# Patient Record
Sex: Male | Born: 1955 | Race: Black or African American | Hispanic: No | Marital: Single | Smoking: Former smoker
Health system: Southern US, Community
[De-identification: ages and names within clinical notes are randomized; demographics above are authoritative.]

## PROBLEM LIST (undated history)

## (undated) DIAGNOSIS — E663 Overweight: Secondary | ICD-10-CM

## (undated) DIAGNOSIS — I2510 Atherosclerotic heart disease of native coronary artery without angina pectoris: Secondary | ICD-10-CM

## (undated) DIAGNOSIS — I10 Essential (primary) hypertension: Secondary | ICD-10-CM

## (undated) DIAGNOSIS — Z720 Tobacco use: Secondary | ICD-10-CM

## (undated) DIAGNOSIS — I255 Ischemic cardiomyopathy: Secondary | ICD-10-CM

## (undated) DIAGNOSIS — I5022 Chronic systolic (congestive) heart failure: Secondary | ICD-10-CM

## (undated) DIAGNOSIS — E785 Hyperlipidemia, unspecified: Secondary | ICD-10-CM

---

## 2014-02-18 ENCOUNTER — Inpatient Hospital Stay (HOSPITAL_COMMUNITY)
Admission: EM | Admit: 2014-02-18 | Discharge: 2014-02-25 | DRG: 246 | Disposition: A | Discharge status: Home / Self Care | Payer: BC Managed Care – PPO | Attending: Cardiovascular Disease | Admitting: Cardiovascular Disease

## 2014-02-18 DIAGNOSIS — I255 Ischemic cardiomyopathy: Secondary | ICD-10-CM

## 2014-02-18 DIAGNOSIS — Z955 Presence of coronary angioplasty implant and graft: Secondary | ICD-10-CM

## 2014-02-18 DIAGNOSIS — Z87891 Personal history of nicotine dependence: Secondary | ICD-10-CM

## 2014-02-18 DIAGNOSIS — I32 Pericarditis in diseases classified elsewhere: Secondary | ICD-10-CM

## 2014-02-18 DIAGNOSIS — I2510 Atherosclerotic heart disease of native coronary artery without angina pectoris: Secondary | ICD-10-CM

## 2014-02-18 DIAGNOSIS — I2109 ST elevation (STEMI) myocardial infarction involving other coronary artery of anterior wall: Principal | ICD-10-CM

## 2014-02-18 DIAGNOSIS — I219 Acute myocardial infarction, unspecified: Secondary | ICD-10-CM

## 2014-02-18 DIAGNOSIS — R7309 Other abnormal glucose: Secondary | ICD-10-CM | POA: Diagnosis present

## 2014-02-18 DIAGNOSIS — I2589 Other forms of chronic ischemic heart disease: Secondary | ICD-10-CM | POA: Diagnosis present

## 2014-02-18 DIAGNOSIS — I5031 Acute diastolic (congestive) heart failure: Secondary | ICD-10-CM

## 2014-02-18 DIAGNOSIS — I959 Hypotension, unspecified: Secondary | ICD-10-CM | POA: Diagnosis not present

## 2014-02-18 DIAGNOSIS — I5041 Acute combined systolic (congestive) and diastolic (congestive) heart failure: Secondary | ICD-10-CM

## 2014-02-18 DIAGNOSIS — J9600 Acute respiratory failure, unspecified whether with hypoxia or hypercapnia: Secondary | ICD-10-CM

## 2014-02-18 DIAGNOSIS — E785 Hyperlipidemia, unspecified: Secondary | ICD-10-CM

## 2014-02-18 DIAGNOSIS — I509 Heart failure, unspecified: Secondary | ICD-10-CM | POA: Diagnosis present

## 2014-02-18 DIAGNOSIS — F17200 Nicotine dependence, unspecified, uncomplicated: Secondary | ICD-10-CM | POA: Diagnosis present

## 2014-02-18 DIAGNOSIS — Z720 Tobacco use: Secondary | ICD-10-CM | POA: Diagnosis present

## 2014-02-18 DIAGNOSIS — I10 Essential (primary) hypertension: Secondary | ICD-10-CM | POA: Diagnosis present

## 2014-02-18 DIAGNOSIS — I241 Dressler's syndrome: Secondary | ICD-10-CM

## 2014-02-18 HISTORY — DX: Essential (primary) hypertension: I10

## 2014-02-18 HISTORY — DX: Overweight: E66.3

## 2014-02-18 HISTORY — DX: Hyperlipidemia, unspecified: E78.5

## 2014-02-18 HISTORY — DX: Ischemic cardiomyopathy: I25.5

## 2014-02-18 HISTORY — DX: Tobacco use: Z72.0

## 2014-02-18 HISTORY — DX: Atherosclerotic heart disease of native coronary artery without angina pectoris: I25.10

## 2014-02-19 ENCOUNTER — Encounter (HOSPITAL_COMMUNITY)
Admission: EM | Disposition: A | Discharge status: Home / Self Care | Payer: BC Managed Care – PPO | Source: Home / Self Care | Attending: Cardiovascular Disease

## 2014-02-19 ENCOUNTER — Ambulatory Visit (HOSPITAL_COMMUNITY): Admit: 2014-02-19 | Payer: Self-pay | Admitting: Cardiovascular Disease

## 2014-02-19 ENCOUNTER — Inpatient Hospital Stay (HOSPITAL_COMMUNITY): Payer: BC Managed Care – PPO

## 2014-02-19 ENCOUNTER — Encounter (HOSPITAL_COMMUNITY): Payer: Self-pay | Admitting: Emergency Medicine

## 2014-02-19 DIAGNOSIS — I2109 ST elevation (STEMI) myocardial infarction involving other coronary artery of anterior wall: Secondary | ICD-10-CM

## 2014-02-19 DIAGNOSIS — I959 Hypotension, unspecified: Secondary | ICD-10-CM | POA: Diagnosis not present

## 2014-02-19 DIAGNOSIS — I5031 Acute diastolic (congestive) heart failure: Secondary | ICD-10-CM | POA: Insufficient documentation

## 2014-02-19 DIAGNOSIS — I5041 Acute combined systolic (congestive) and diastolic (congestive) heart failure: Secondary | ICD-10-CM | POA: Diagnosis present

## 2014-02-19 DIAGNOSIS — I2589 Other forms of chronic ischemic heart disease: Secondary | ICD-10-CM | POA: Diagnosis present

## 2014-02-19 DIAGNOSIS — J9600 Acute respiratory failure, unspecified whether with hypoxia or hypercapnia: Secondary | ICD-10-CM | POA: Diagnosis present

## 2014-02-19 DIAGNOSIS — I509 Heart failure, unspecified: Secondary | ICD-10-CM | POA: Diagnosis present

## 2014-02-19 DIAGNOSIS — I10 Essential (primary) hypertension: Secondary | ICD-10-CM | POA: Diagnosis present

## 2014-02-19 DIAGNOSIS — R7309 Other abnormal glucose: Secondary | ICD-10-CM | POA: Diagnosis present

## 2014-02-19 DIAGNOSIS — I2510 Atherosclerotic heart disease of native coronary artery without angina pectoris: Secondary | ICD-10-CM | POA: Diagnosis present

## 2014-02-19 DIAGNOSIS — R079 Chest pain, unspecified: Secondary | ICD-10-CM | POA: Diagnosis present

## 2014-02-19 DIAGNOSIS — F17200 Nicotine dependence, unspecified, uncomplicated: Secondary | ICD-10-CM | POA: Diagnosis present

## 2014-02-19 DIAGNOSIS — E785 Hyperlipidemia, unspecified: Secondary | ICD-10-CM | POA: Diagnosis present

## 2014-02-19 DIAGNOSIS — Z87891 Personal history of nicotine dependence: Secondary | ICD-10-CM | POA: Diagnosis not present

## 2014-02-19 HISTORY — PX: LEFT HEART CATHETERIZATION WITH CORONARY ANGIOGRAM: SHX5451

## 2014-02-19 HISTORY — PX: PERCUTANEOUS CORONARY STENT INTERVENTION (PCI-S): SHX5485

## 2014-02-19 LAB — CBC
HCT: 54.6 % — ABNORMAL HIGH (ref 39.0–52.0)
HEMATOCRIT: 46 % (ref 39.0–52.0)
HEMATOCRIT: 50.4 % (ref 39.0–52.0)
HEMOGLOBIN: 18.2 g/dL — AB (ref 13.0–17.0)
Hemoglobin: 15.3 g/dL (ref 13.0–17.0)
Hemoglobin: 16.4 g/dL (ref 13.0–17.0)
MCH: 28.8 pg (ref 26.0–34.0)
MCH: 29.2 pg (ref 26.0–34.0)
MCH: 29.2 pg (ref 26.0–34.0)
MCHC: 32.5 g/dL (ref 30.0–36.0)
MCHC: 33.3 g/dL (ref 30.0–36.0)
MCHC: 33.3 g/dL (ref 30.0–36.0)
MCV: 87.5 fL (ref 78.0–100.0)
MCV: 87.8 fL (ref 78.0–100.0)
MCV: 88.6 fL (ref 78.0–100.0)
PLATELETS: 180 10*3/uL (ref 150–400)
Platelets: 170 10*3/uL (ref 150–400)
Platelets: 170 10*3/uL (ref 150–400)
RBC: 5.24 MIL/uL (ref 4.22–5.81)
RBC: 5.69 MIL/uL (ref 4.22–5.81)
RBC: 6.24 MIL/uL — ABNORMAL HIGH (ref 4.22–5.81)
RDW: 14.3 % (ref 11.5–15.5)
RDW: 14.5 % (ref 11.5–15.5)
RDW: 14.6 % (ref 11.5–15.5)
WBC: 12.1 10*3/uL — ABNORMAL HIGH (ref 4.0–10.5)
WBC: 8 10*3/uL (ref 4.0–10.5)
WBC: 8.4 10*3/uL (ref 4.0–10.5)

## 2014-02-19 LAB — BASIC METABOLIC PANEL
BUN: 15 mg/dL (ref 6–23)
BUN: 16 mg/dL (ref 6–23)
CALCIUM: 8.8 mg/dL (ref 8.4–10.5)
CO2: 22 mEq/L (ref 19–32)
CO2: 24 mEq/L (ref 19–32)
Calcium: 8.6 mg/dL (ref 8.4–10.5)
Chloride: 105 mEq/L (ref 96–112)
Chloride: 107 mEq/L (ref 96–112)
Creatinine, Ser: 0.96 mg/dL (ref 0.50–1.35)
Creatinine, Ser: 1.11 mg/dL (ref 0.50–1.35)
GFR calc Af Amer: 84 mL/min — ABNORMAL LOW (ref >90)
GFR calc Af Amer: >90 mL/min (ref >90)
GFR, EST NON AFRICAN AMERICAN: 72 mL/min — AB (ref >90)
GLUCOSE: 107 mg/dL — AB (ref 70–99)
GLUCOSE: 157 mg/dL — AB (ref 70–99)
POTASSIUM: 3.9 meq/L (ref 3.7–5.3)
Potassium: 3.9 mEq/L (ref 3.7–5.3)
SODIUM: 143 meq/L (ref 137–147)
Sodium: 143 mEq/L (ref 137–147)

## 2014-02-19 LAB — DIFFERENTIAL
Basophils Absolute: 0 10*3/uL (ref 0.0–0.1)
Basophils Relative: 1 % (ref 0–1)
EOS PCT: 4 % (ref 0–5)
Eosinophils Absolute: 0.4 10*3/uL (ref 0.0–0.7)
LYMPHS ABS: 3.7 10*3/uL (ref 0.7–4.0)
Lymphocytes Relative: 43 % (ref 12–46)
MONO ABS: 0.9 10*3/uL (ref 0.1–1.0)
MONOS PCT: 10 % (ref 3–12)
Neutro Abs: 3.5 10*3/uL (ref 1.7–7.7)
Neutrophils Relative %: 42 % — ABNORMAL LOW (ref 43–77)

## 2014-02-19 LAB — I-STAT CHEM 8, ED
BUN: 15 mg/dL (ref 6–23)
CREATININE: 1.2 mg/dL (ref 0.50–1.35)
Calcium, Ion: 1.13 mmol/L (ref 1.12–1.23)
Chloride: 106 mEq/L (ref 96–112)
Glucose, Bld: 150 mg/dL — ABNORMAL HIGH (ref 70–99)
HEMATOCRIT: 51 % (ref 39.0–52.0)
HEMOGLOBIN: 17.3 g/dL — AB (ref 13.0–17.0)
POTASSIUM: 3.7 meq/L (ref 3.7–5.3)
SODIUM: 144 meq/L (ref 137–147)
TCO2: 23 mmol/L (ref 0–100)

## 2014-02-19 LAB — TROPONIN I
Troponin I: <0.3 ng/mL (ref <0.30)
Troponin I: >20 ng/mL (ref <0.30)

## 2014-02-19 LAB — TSH: TSH: 0.523 u[IU]/mL (ref 0.350–4.500)

## 2014-02-19 LAB — POCT I-STAT TROPONIN I: Troponin i, poc: 0.01 ng/mL (ref 0.00–0.08)

## 2014-02-19 LAB — APTT: aPTT: 29 seconds (ref 24–37)

## 2014-02-19 LAB — LIPID PANEL
CHOLESTEROL: 164 mg/dL (ref 0–200)
HDL: 40 mg/dL (ref >39)
LDL Cholesterol: 114 mg/dL — ABNORMAL HIGH (ref 0–99)
TRIGLYCERIDES: 49 mg/dL (ref <150)
Total CHOL/HDL Ratio: 4.1 RATIO
VLDL: 10 mg/dL (ref 0–40)

## 2014-02-19 LAB — HEMOGLOBIN A1C
HEMOGLOBIN A1C: 6.1 % — AB (ref <5.7)
Mean Plasma Glucose: 128 mg/dL — ABNORMAL HIGH (ref <117)

## 2014-02-19 LAB — PRO B NATRIURETIC PEPTIDE: Pro B Natriuretic peptide (BNP): 113.7 pg/mL (ref 0–125)

## 2014-02-19 LAB — PROTIME-INR
INR: 0.98 (ref 0.00–1.49)
PROTHROMBIN TIME: 12.8 s (ref 11.6–15.2)

## 2014-02-19 LAB — MRSA PCR SCREENING: MRSA BY PCR: NEGATIVE

## 2014-02-19 SURGERY — LEFT HEART CATHETERIZATION WITH CORONARY ANGIOGRAM
Anesthesia: LOCAL

## 2014-02-19 MED ORDER — HEPARIN SODIUM (PORCINE) 5000 UNIT/ML IJ SOLN
4000.0000 [IU] | Freq: Once | INTRAMUSCULAR | Status: AC
  Administered 2014-02-19: 4000 [IU] via INTRAVENOUS

## 2014-02-19 MED ORDER — PNEUMOCOCCAL VAC POLYVALENT 25 MCG/0.5ML IJ INJ
0.5000 mL | INJECTION | INTRAMUSCULAR | Status: DC
  Filled 2014-02-19: qty 0.5

## 2014-02-19 MED ORDER — INFLUENZA VAC SPLIT QUAD 0.5 ML IM SUSP
0.5000 mL | INTRAMUSCULAR | Status: DC
  Filled 2014-02-19: qty 0.5

## 2014-02-19 MED ORDER — TICAGRELOR 90 MG PO TABS
ORAL_TABLET | ORAL | Status: AC
Start: 2014-02-19 — End: 2014-02-19
  Filled 2014-02-19: qty 2

## 2014-02-19 MED ORDER — SODIUM CHLORIDE 0.9 % IJ SOLN
3.0000 mL | Freq: Two times a day (BID) | INTRAMUSCULAR | Status: DC
  Administered 2014-02-19 – 2014-02-24 (×13): 3 mL via INTRAVENOUS

## 2014-02-19 MED ORDER — HEPARIN SODIUM (PORCINE) 5000 UNIT/ML IJ SOLN
5000.0000 [IU] | Freq: Three times a day (TID) | INTRAMUSCULAR | Status: DC
  Administered 2014-02-20 – 2014-02-25 (×15): 5000 [IU] via SUBCUTANEOUS
  Filled 2014-02-19 (×18): qty 1

## 2014-02-19 MED ORDER — NITROGLYCERIN 0.4 MG SL SUBL
0.4000 mg | SUBLINGUAL_TABLET | SUBLINGUAL | Status: DC | PRN
  Administered 2014-02-23: 0.4 mg via SUBLINGUAL
  Filled 2014-02-19: qty 1

## 2014-02-19 MED ORDER — HEPARIN SODIUM (PORCINE) 5000 UNIT/ML IJ SOLN
INTRAMUSCULAR | Status: AC
  Filled 2014-02-19: qty 1

## 2014-02-19 MED ORDER — VERAPAMIL HCL 2.5 MG/ML IV SOLN
INTRAVENOUS | Status: AC
  Filled 2014-02-19: qty 2

## 2014-02-19 MED ORDER — FUROSEMIDE 10 MG/ML IJ SOLN
INTRAMUSCULAR | Status: AC
  Filled 2014-02-19: qty 4

## 2014-02-19 MED ORDER — HYDROMORPHONE HCL PF 1 MG/ML IJ SOLN
INTRAMUSCULAR | Status: AC
  Filled 2014-02-19: qty 1

## 2014-02-19 MED ORDER — NITROGLYCERIN 0.2 MG/ML ON CALL CATH LAB
INTRAVENOUS | Status: AC
  Filled 2014-02-19: qty 1

## 2014-02-19 MED ORDER — SODIUM CHLORIDE 0.9 % IV SOLN: 250.0000 mL | INTRAVENOUS | Status: DC | PRN

## 2014-02-19 MED ORDER — FENTANYL CITRATE 0.05 MG/ML IJ SOLN
INTRAMUSCULAR | Status: AC
  Filled 2014-02-19: qty 2

## 2014-02-19 MED ORDER — TIROFIBAN HCL IV 5 MG/100ML
0.1500 ug/kg/min | INTRAVENOUS | Status: AC
  Administered 2014-02-19: 0.15 ug/kg/min via INTRAVENOUS
  Filled 2014-02-19 (×2): qty 100

## 2014-02-19 MED ORDER — ATORVASTATIN CALCIUM 80 MG PO TABS
80.0000 mg | ORAL_TABLET | Freq: Every day | ORAL | Status: DC
  Administered 2014-02-19 – 2014-02-24 (×6): 80 mg via ORAL
  Filled 2014-02-19 (×7): qty 1

## 2014-02-19 MED ORDER — BIVALIRUDIN 250 MG IV SOLR
INTRAVENOUS | Status: AC
Start: 2014-02-19 — End: 2014-02-19
  Filled 2014-02-19: qty 250

## 2014-02-19 MED ORDER — SPIRONOLACTONE 12.5 MG HALF TABLET
12.5000 mg | ORAL_TABLET | Freq: Every day | ORAL | Status: DC
  Administered 2014-02-19 – 2014-02-25 (×7): 12.5 mg via ORAL
  Filled 2014-02-19 (×7): qty 1

## 2014-02-19 MED ORDER — HEPARIN (PORCINE) IN NACL 2-0.9 UNIT/ML-% IJ SOLN
INTRAMUSCULAR | Status: AC
  Filled 2014-02-19: qty 1500

## 2014-02-19 MED ORDER — FUROSEMIDE 10 MG/ML IJ SOLN
40.0000 mg | Freq: Once | INTRAMUSCULAR | Status: AC
  Administered 2014-02-19: 40 mg via INTRAVENOUS

## 2014-02-19 MED ORDER — POTASSIUM CHLORIDE ER 10 MEQ PO TBCR
20.0000 meq | EXTENDED_RELEASE_TABLET | Freq: Two times a day (BID) | ORAL | Status: DC
  Administered 2014-02-19 (×2): 20 meq via ORAL
  Filled 2014-02-19 (×4): qty 2

## 2014-02-19 MED ORDER — ENALAPRIL MALEATE 5 MG PO TABS
5.0000 mg | ORAL_TABLET | Freq: Two times a day (BID) | ORAL | Status: DC
  Administered 2014-02-19 – 2014-02-20 (×4): 5 mg via ORAL
  Filled 2014-02-19 (×6): qty 1

## 2014-02-19 MED ORDER — TICAGRELOR 90 MG PO TABS
90.0000 mg | ORAL_TABLET | Freq: Two times a day (BID) | ORAL | Status: DC
  Administered 2014-02-19 – 2014-02-25 (×13): 90 mg via ORAL
  Filled 2014-02-19 (×15): qty 1

## 2014-02-19 MED ORDER — OXYCODONE-ACETAMINOPHEN 5-325 MG PO TABS: 1.0000 | ORAL_TABLET | ORAL | Status: DC | PRN

## 2014-02-19 MED ORDER — LIDOCAINE HCL (PF) 1 % IJ SOLN
INTRAMUSCULAR | Status: AC
  Filled 2014-02-19: qty 30

## 2014-02-19 MED ORDER — SODIUM CHLORIDE 0.9 % IJ SOLN: 3.0000 mL | INTRAMUSCULAR | Status: DC | PRN

## 2014-02-19 MED ORDER — FUROSEMIDE 10 MG/ML IJ SOLN
40.0000 mg | Freq: Three times a day (TID) | INTRAMUSCULAR | Status: DC
  Administered 2014-02-19 – 2014-02-20 (×3): 40 mg via INTRAVENOUS
  Filled 2014-02-19 (×6): qty 4

## 2014-02-19 MED ORDER — MORPHINE SULFATE 2 MG/ML IJ SOLN
2.0000 mg | INTRAMUSCULAR | Status: DC | PRN
  Filled 2014-02-19: qty 1

## 2014-02-19 MED ORDER — ASPIRIN EC 81 MG PO TBEC
81.0000 mg | DELAYED_RELEASE_TABLET | Freq: Every day | ORAL | Status: DC
  Administered 2014-02-21 – 2014-02-25 (×5): 81 mg via ORAL
  Filled 2014-02-19 (×6): qty 1

## 2014-02-19 MED ORDER — TIROFIBAN HCL IV 12.5 MG/250 ML
INTRAVENOUS | Status: AC
Start: 2014-02-19 — End: 2014-02-19
  Filled 2014-02-19: qty 250

## 2014-02-19 MED ORDER — MIDAZOLAM HCL 2 MG/2ML IJ SOLN
INTRAMUSCULAR | Status: AC
  Filled 2014-02-19: qty 2

## 2014-02-19 NOTE — Progress Notes (Signed)
Subjective:   58 y/o previously healthy male admitted this am with acute anterolateral STEMI.  S/p Xience DES to LAD 100%. Residual 80% pRCA.  LVEDP 39. EF 55%  This am c/o mild SOB. Lethargic after receiving dilaudid. Sats ~70s% No CP. Trop > 20.   CXR diffuse pulmonary edema   Intake/Output Summary (Last 24 hours) at 02/19/14 1037 Last data filed at 02/19/14 0500  Gross per 24 hour  Intake   63.7 ml  Output      0 ml  Net   63.7 ml    Current meds: . [START ON 02/20/2014] aspirin EC  81 mg Oral Daily  . atorvastatin  80 mg Oral q1800  . furosemide      . [START ON 02/20/2014] heparin  5,000 Units Subcutaneous 3 times per day  . sodium chloride  3 mL Intravenous Q12H  . Ticagrelor  90 mg Oral BID   Infusions:     Objective:  Blood pressure 135/85, pulse 77, temperature 99 F (37.2 C), temperature source Oral, resp. rate 31, height 6\' 4"  (1.93 m), weight 114.2 kg (251 lb 12.3 oz), SpO2 91.00%. Weight change:   Physical Exam: General:  Lethargic bur arousable HEENT: normal Neck: supple. JVP jaw . Carotids 2+ bilat; no bruits. No lymphadenopathy or thryomegaly appreciated. Cor: PMI nondisplaced. Regular rate & rhythm. No rubs, gallops or murmurs. Lungs: diffuse crackles Abdomen: soft, nontender, nondistended. No hepatosplenomegaly. No bruits or masses. Good bowel sounds. Extremities: no cyanosis, clubbing, rash, edema Neuro: lethargic but arousable & orientedx3, cranial nerves grossly intact. moves all 4 extremities w/o difficulty. Affect pleasant  Telemetry: SR  Lab Results: Basic Metabolic Panel:  Recent Labs Lab 02/19/14 0005 02/19/14 0012 02/19/14 0240  NA 143 144 143  K 3.9 3.7 3.9  CL 107 106 105  CO2 22  --  24  GLUCOSE 157* 150* 107*  BUN 15 15 16   CREATININE 1.11 1.20 0.96  CALCIUM 8.6  --  8.8   Liver Function Tests: No results found for this basename: AST, ALT, ALKPHOS, BILITOT, PROT, ALBUMIN,  in the last 168 hours No results found for  this basename: LIPASE, AMYLASE,  in the last 168 hours No results found for this basename: AMMONIA,  in the last 168 hours CBC:  Recent Labs Lab 02/19/14 0005 02/19/14 0012 02/19/14 0240  WBC 8.4  --  8.0  NEUTROABS 3.5  --   --   HGB 15.3 17.3* 16.4  HCT 46.0 51.0 50.4  MCV 87.8  --  88.6  PLT 180  --  170   Cardiac Enzymes:  Recent Labs Lab 02/19/14 0005 02/19/14 0240  TROPONINI <0.30 >20.00*   BNP: No components found with this basename: POCBNP,  CBG: No results found for this basename: GLUCAP,  in the last 168 hours Microbiology: No results found for this basename: cult   No results found for this basename: CULT, SDES,  in the last 168 hours  Imaging: Dg Chest Port 1 View  02/19/2014   CLINICAL DATA:  SOB  EXAM: PORTABLE CHEST - 1 VIEW  COMPARISON:  None.  FINDINGS: The cardiac silhouette is enlarged. Diffuse bilateral pulmonary opacities are appreciated. There is prominence of the interstitial markings, peribronchial cuffing, and vascular indistinctness. The osseous structures are unremarkable. No focal regions of consolidation are appreciated.  IMPRESSION: Diffuse interstitial infiltrate. Differential considerations pulmonary edema, versus an infectious or inflammatory interstitial infiltrate. Surveillance evaluation recommended.   Electronically Signed   By: Salome HolmesHector  Cooper M.D.  On: 02/19/2014 08:08     ASSESSMENT:  1. Acute anterolateral STEM 3/21   ---s/p Xience DES to LAD 3/21   --EF 55% with LVEDP 39 2. CAD    --residual proximal 80% RCA 3. Acute diastolic HF due to #1 4. Acute respiratory failure 5. HTN   PLAN/DISCUSSION:  Respiratory status very tenuous. Will place BIPAP. Aggressive diuresis (alread 1.5L out this am). Start ACE and spiro. If sats not improving quickly on BIPAP will give narcan and check ABG. No b-blocker given resp status. Check echo.   Continue ASA and brillinta, statin. Approach to RCA lesion per interventional team.   CR  consult when improved.   The patient is critically ill with multiple organ systems failure and requires high complexity decision making for assessment and support, frequent evaluation and titration of therapies, application of advanced monitoring technologies and extensive interpretation of multiple databases.   Critical Care Time devoted to patient care services described in this note is 35 Minutes.    LOS: 1 day    Arvilla Meres, MD 02/19/2014, 10:37 AM

## 2014-02-19 NOTE — Progress Notes (Signed)
ANTICOAGULATION CONSULT NOTE - Initial Consult  Pharmacy Consult for Tirofiban  Indication: s/p cath  No Known Allergies  Patient Measurements: Pt reported wt: ~109kg  Vital Signs: Temp: 97.6 F (36.4 C) (03/21 0003) Temp src: Oral (03/21 0003) BP: 122/76 mmHg (03/21 0003) Pulse Rate: 64 (03/21 0016)  Labs:  Recent Labs  02/19/14 0005 02/19/14 0012  HGB 15.3 17.3*  HCT 46.0 51.0  PLT 180  --   APTT 29  --   LABPROT 12.8  --   INR 0.98  --   CREATININE 1.11 1.20  TROPONINI <0.30  --     Assessment: 58 y/o M s/p cath with DES to LAD   Plan:  -Tirofiban 0.15 mcg/kg/min for 6 hours per MD request  -0500 CBC with AM labs   Abran Duke 02/19/2014,1:44 AM

## 2014-02-19 NOTE — Progress Notes (Signed)
EKG CRITICAL VALUE     12 lead EKG performed.  Critical value noted.  Fritzi Mandes, RN notified.   Almedia Balls, [place] 02/19/2014 8:03 AM

## 2014-02-19 NOTE — Interval H&P Note (Signed)
History and Physical Interval Note:  02/19/2014 1:39 AM  [name]  has presented today for surgery, with the diagnosis of Stemi  The various methods of treatment have been discussed with the patient and family. After consideration of risks, benefits and other options for treatment, the patient has consented to  Procedure(s): LEFT HEART CATHETERIZATION WITH CORONARY ANGIOGRAM (N/A) PERCUTANEOUS CORONARY STENT INTERVENTION (PCI-S) as a surgical intervention .  The patient's history has been reviewed, patient examined, no change in status, stable for surgery.  I have reviewed the patient's chart and labs.  Questions were answered to the patient's satisfaction.    Cath Lab Visit (complete for each Cath Lab visit)  Clinical Evaluation Leading to the Procedure:   ACS: yes  Non-ACS:    Anginal Classification: CCS IV  Anti-ischemic medical therapy: No Therapy  Non-Invasive Test Results: No non-invasive testing performed  Prior CABG: No previous CABG       [name]

## 2014-02-19 NOTE — Progress Notes (Signed)
CRITICAL VALUE ALERT  Critical value received:  Troponin > 20  Date of notification:  02/19/14  Time of notification:  0443  Critical value read back:yes  Nurse who received alert:  Toula Moos  MD notified (1st page):  Expected value

## 2014-02-19 NOTE — ED Provider Notes (Signed)
CSN: [id number]     Arrival date & time 02/18/14  2359 History   First MD Initiated Contact with Patient 02/19/14 0002     Chief Complaint  Patient presents with  . Code STEMI     (Consider location/radiation/quality/duration/timing/severity/associated sxs/prior Treatment) HPI Comments: 58 year old male, denies history of any medical problems, started taking a muscle relaxer today at 5:00 PM. When he went to work tonight at 11:00 PM he developed acute onset of diaphoresis and chest discomfort. The patient is somewhat altered on arrival, he appears sleepy, does not answer questions very usually more consistently and is vague with his answers. He describes being extremely diaphoretic while he was at work. He poured water over his head as well. He denies any other significant symptoms. Paramedics found the patient to be ill-appearing, ran an EKG and found him to have ST elevations. Noticed any was activated, the patient was given nitroglycerin x4, aspirin x4 and morphine x6 mg. Dr. Excell Seltzer was made aware of the patient and was at the bedside on the patient's arrival in the emergency department.   The history is provided by the patient and the EMS personnel.    History reviewed. No pertinent past medical history. History reviewed. No pertinent past surgical history. No family history on file. History  Substance Use Topics  . Smoking status: Light Tobacco Smoker  . Smokeless tobacco: Not on file  . Alcohol Use: Not on file    Review of Systems  All other systems reviewed and are negative.      Allergies  Review of patient's allergies indicates no known allergies.  Home Medications  No current outpatient prescriptions on file. BP 135/85  Pulse 81  Temp(Src) 99 F (37.2 C) (Oral)  Resp 16  Ht 6\' 4"  (1.93 m)  Wt 251 lb 12.3 oz (114.2 kg)  BMI 30.66 kg/m2  SpO2 96% Physical Exam  Nursing note and vitals reviewed. Constitutional: He appears well-developed and well-nourished. No  distress.  HENT:  Head: Normocephalic and atraumatic.  Mouth/Throat: Oropharynx is clear and moist. No oropharyngeal exudate.  Eyes: Conjunctivae and EOM are normal. Pupils are equal, round, and reactive to light. Right eye exhibits no discharge. Left eye exhibits no discharge. No scleral icterus.  Neck: Normal range of motion. Neck supple. No JVD present. No thyromegaly present.  Cardiovascular: Normal rate, regular rhythm, normal heart sounds and intact distal pulses.  Exam reveals no gallop and no friction rub.   No murmur heard. Pulmonary/Chest: Effort normal and breath sounds normal. No respiratory distress. He has no wheezes. He has no rales.  Abdominal: Soft. Bowel sounds are normal. He exhibits no distension and no mass. There is no tenderness.  Musculoskeletal: Normal range of motion. He exhibits no edema and no tenderness.  Lymphadenopathy:    He has no cervical adenopathy.  Neurological: He is alert. Coordination normal.  Patient is slow to answer questions, axis sleepy but follows commands.  Skin: Skin is warm and dry. No rash noted. No erythema.  Psychiatric: He has a normal mood and affect. His behavior is normal.    ED Course  Procedures (including critical care time) Labs Review Labs Reviewed  DIFFERENTIAL - Abnormal; Notable for the following:    Neutrophils Relative % 42 (*)    All other components within normal limits  BASIC METABOLIC PANEL - Abnormal; Notable for the following:    Glucose, Bld 157 (*)    GFR calc non Af Amer 72 (*)    GFR calc Af  Amer 84 (*)    All other components within normal limits  TROPONIN I - Abnormal; Notable for the following:    Troponin I >20.00 (*)    All other components within normal limits  LIPID PANEL - Abnormal; Notable for the following:    LDL Cholesterol 114 (*)    All other components within normal limits  BASIC METABOLIC PANEL - Abnormal; Notable for the following:    Glucose, Bld 107 (*)    All other components within  normal limits  I-STAT CHEM 8, ED - Abnormal; Notable for the following:    Glucose, Bld 150 (*)    Hemoglobin 17.3 (*)    All other components within normal limits  MRSA PCR SCREENING  CBC  PROTIME-INR  APTT  TROPONIN I  CBC  PRO B NATRIURETIC PEPTIDE  TROPONIN I  TROPONIN I  TSH  HEMOGLOBIN A1C  CBC  I-STAT TROPOININ, ED  POCT I-STAT TROPONIN I   Imaging Review No results found.   EKG Interpretation   Date/Time:  Saturday February 19 2014 00:02:48 EDT Ventricular Rate:  58 PR Interval:  178 QRS Duration: 97 QT Interval:  455 QTC Calculation: 447 R Axis:   10 Text Interpretation:  ** ** ACUTE MI / STEMI ** ** Sinus bradycardia ST  elevation in Precordial leads Abnormal ekg No old tracing to compare  Confirmed by Lyly Canizales  MD, Akeelah Seppala ([id number]) on 02/19/2014 12:19:06 AM      MDM   Final diagnoses:  ST elevation myocardial infarction (STEMI) of anterolateral wall, initial episode of care    Prehospital EKG concerning for STEMI, cardiology at present, patient has been given aspirin prior to arrival, will activate catheterization lab.  Heparin given prior to catheterization lab  Cardiology at bedside, will take the patient to the catheterization lab. Vital signs normal   Meds given in ED:  Medications  aspirin EC tablet 81 mg (not administered)  nitroGLYCERIN (NITROSTAT) SL tablet 0.4 mg (not administered)  heparin injection 5,000 Units (not administered)  Ticagrelor (BRILINTA) tablet 90 mg (not administered)  atorvastatin (LIPITOR) tablet 80 mg (not administered)  sodium chloride 0.9 % injection 3 mL (3 mLs Intravenous Given 02/19/14 0514)  sodium chloride 0.9 % injection 3 mL (not administered)  0.9 %  sodium chloride infusion (not administered)  oxyCODONE-acetaminophen (PERCOCET/ROXICET) 5-325 MG per tablet 1-2 tablet (not administered)  morphine 2 MG/ML injection 2 mg (not administered)  tirofiban (AGGRASTAT) infusion 50 mcg/mL 100 mL (0.15 mcg/kg/min  109 kg  (Order-Specific) Intravenous New Bag/Given 02/19/14 0145)  heparin injection 4,000 Units (4,000 Units Intravenous Given 02/19/14 0003)  Ticagrelor (BRILINTA) 90 MG tablet (not administered)  bivalirudin (ANGIOMAX) 250 MG injection (not administered)  fentaNYL (SUBLIMAZE) 0.05 MG/ML injection (not administered)  midazolam (VERSED) 2 MG/2ML injection (not administered)  verapamil (ISOPTIN) 2.5 MG/ML injection (not administered)  HYDROmorphone (DILAUDID) 1 MG/ML injection (not administered)  tirofiban (AGGRASTAT) 50 mcg/mL injection (not administered)  verapamil (ISOPTIN) 2.5 MG/ML injection (not administered)  lidocaine (PF) (XYLOCAINE) 1 % injection (not administered)  heparin 2-0.9 UNIT/ML-% infusion (not administered)  nitroGLYCERIN (NTG ON-CALL) 0.2 mg/mL injection (not administered)    Current Discharge Medication List        Vida RollerBrian D Zafir Schauer, MD 02/19/14 [phone]

## 2014-02-19 NOTE — H&P (Signed)
History and Physical  Patient ID: Medhansh Spangler MRN: [id number], SOB: Dec 30, 1955 58 y.o. Date of Encounter: 02/19/2014, 12:11 AM  Primary Physician: No primary provider on file. Primary Cardiologist: none  Chief Complaint: chest pain  HPI: 58 y.o. male with no significant past medical history who presented to [facility] on 02/19/2014 with complaints of chest pain. EMS was called from his work after he developed severe substernal chest pain. This began at about 11pm tonight per the patient's report. Hx is limited as the patient has apparently taking a muscle relaxant and has been morphine by EMS. He has also received ASA 324 mg and heparin 4000 units in the ER. He currently complains of 7/10 chest pain and is moaning with discomfort. There is associated shortness of breath and diaphoresis.   He denies problems such as HTN or diabetes. He does smoke 'sometimes.' denies drug use. Admits to alcohol 'sometimes.' No past hx of cardiac problems and no history of chest pain prior to tonight.  EKG from the field showed an acute anterolateral MI and a Code STEMI was activated by EMS.  No past medical history on file.   Surgical History: No past surgical history on file.   Home Meds: Prior to Admission medications   Not on File    Allergies: Allergies not on file  History   Social History  . Marital Status: Single    Spouse Name: N/A    Number of Children: N/A  . Years of Education: N/A   Occupational History  . Not on file.   Social History Main Topics  . Smoking status: Not on file  . Smokeless tobacco: Not on file  . Alcohol Use: Not on file  . Drug Use: Not on file  . Sexual Activity: Not on file   Other Topics Concern  . Not on file   Social History Narrative  . No narrative on file     Family Hx: No known hx of CAD  Review of Systems: unable to obtain an accurate ROS because of the patient's current mental status  Physical Exam: BP 122/76, HR 61, R  16 General: Well developed, well nourished, moderate distress, sedated. HEENT: Normocephalic, atraumatic, sclera non-icteric, no xanthomas, nares are without discharge.  Neck: Supple. Carotids 2+ without bruits. JVP normal Lungs: Clear bilaterally to auscultation without wheezes, rales, or rhonchi.  Heart: RRR with normal S1 and S2. No murmurs, rubs, or gallops appreciated. Abdomen: Soft, non-tender, non-distended with normoactive bowel sounds.No obvious abdominal masses. Back: No CVA tenderness Msk:  Strength and tone appear normal for age. Extremities: No clubbing, cyanosis, or edema.  Distal pedal pulses are 2+ and equal bilaterally. Neuro: CNII-XII intact, moves all extremities spontaneously.    Labs:   No results found for this basename: WBC, HGB, HCT, MCV, PLT   No results found for this basename: NA, K, CL, CO2, BUN, CREATININE, CALCIUM, LABALBU, PROT, BILITOT, ALKPHOS, ALT, AST, GLUCOSE,  in the last 168 hours No results found for this basename: CKTOTAL, CKMB, TROPONINI,  in the last 72 hours No results found for this basename: CHOL, HDL, LDLCALC, TRIG   No results found for this basename: DDIMER    Radiology/Studies:  No results found.   EKG: NSR with acute anterolateral STEMI pattern  ASSESSMENT AND PLAN:  58 year-old male with no known past medical history presenting with an acute anterolateral STEMI.   Plan:  Emergency cardiac cath and primary PCI  Brilinta 180 mg load  Heparin  bolus and ASA given in ED  Further disposition pending cardiac cath results  Will check lipids, HgB A1C, baseline labs, CXR, etc  Emergency informed consent obtained for procedure  Signed, Tonny BollmanMichael Julliette Frentz  02/19/2014, 12:11 AM

## 2014-02-19 NOTE — ED Notes (Signed)
Received Report from [facility], see Blank Note for report details.

## 2014-02-19 NOTE — Progress Notes (Signed)
Patient more awake at present.  Good diuresis and able to wean to O2 by [place].  NO chest pain.  PUlse stable.  BP 150. I looked at cath films, wonder if circ may be a little more severe than 50%. In light of sedation last night, I would hold off on Ativan.  Darden Palmer MD [facility]

## 2014-02-19 NOTE — ED Notes (Signed)
Pt received 6mg  of Morphine, 324mg  ASA, and 4 sublingual NTG administered en route by EMS.

## 2014-02-19 NOTE — ED Notes (Addendum)
Pt in route to Cath Lab, transported by RN, EMT, and Dr. Excell Seltzer.

## 2014-02-19 NOTE — CV Procedure (Signed)
    Cardiac Catheterization Procedure Note  Name: Austin Allen MRN: [id number] DOB: [birth year]  Procedure: Left Heart Cath, Selective Coronary Angiography, LV angiography, PTCA and stenting of the proximal LAD  Indication: Acute anterolateral STEMI  Procedural Details:  The right wrist was prepped, draped, and anesthetized with 1% lidocaine. Using the modified Seldinger technique, a 5/6 French sheath was introduced into the right radial artery. 3 mg of verapamil was administered through the sheath, weight-based unfractionated heparin was administered intravenously. Standard Judkins catheters were used for selective coronary angiography. Left ventriculography was performed at the completion of the procedure. Catheter exchanges were performed over an exchange length guidewire.  PROCEDURAL FINDINGS Hemodynamics: AO 86/58 LV 90/39   Coronary angiography: Coronary dominance: right  Left mainstem: arises from left cusp  Left anterior descending (LAD): total occlusion in the proximal vessel. After reperfusion, the vessel is large and wraps around the LV apex. There is TIMI-0 flow.  Left circumflex (LCx): large, dominant vessel. There is 50% mid-vessel stenosis. The OM and left PDA branches are patent.   Right coronary artery (RCA): codominant vessel, moderate in caliber. There is an 80% proximal stenosis. Otherwise the vessel is patent. The RV marginal branch and acute marginal and PDA are patent.   Left ventriculography: Left ventricular systolic function is normal, LVEF is estimated at 55%, there is mitral regurgitation related to catheter position (also during diastole).   PCI Note:  Following the diagnostic procedure, the decision was made to proceed with PCI. The patient was loaded with brilinta 180 mg on the table. Weight-based bivalirudin was given for anticoagulation. Once a therapeutic ACT was achieved, a 6 [place] SB-LAD 3.5 cm guide catheter was inserted.  A cougar coronary guidewire  was used to cross the lesion.  The lesion was predilated with a 2.5 mm balloon.  The lesion was then stented with a 3.25 x 18 mm Xience drug-eluting stent.  The stent was postdilated with a 3.75 mm noncompliant balloon.  There was initially slow flow especially at the apical portion of the LAD. However, after a few minutes the flow returned to TIMI-3. Following PCI, there was 0% residual stenosis. Final angiography confirmed an excellent result. The patient tolerated the procedure well. There were no immediate procedural complications. A TR band was used for radial hemostasis. The patient was transferred to the post catheterization recovery area for further monitoring.  PCI Data: Vessel - LAD/Segment - proximal Percent Stenosis (pre)  100 TIMI-flow 0 Stent 3.25x18 mm Xience DES Percent Stenosis (post) 0 TIMI-flow (post) 3  Final Conclusions:   1. Total occlusion of the proximal LAD, treated successfully with primary PCI (drug-eluting stent) 2. Severe proximal RCA stenosis (codominant vessel) 3. Nonobstructive LCx stenosis 4. Preserved LV systolic function with severely elevated LVEDP   Recommendations:  Tx to CCU. ASA/brilinta at least 12 months. aggrastat 6 hours. No beta blocker or ACE yet as BP is marginal. No fluids with elevated LVEDP.  [name] 02/19/2014, 1:09 AM

## 2014-02-20 ENCOUNTER — Inpatient Hospital Stay (HOSPITAL_COMMUNITY): Payer: BC Managed Care – PPO

## 2014-02-20 ENCOUNTER — Encounter (HOSPITAL_COMMUNITY)
Admission: EM | Disposition: A | Discharge status: Home / Self Care | Payer: BC Managed Care – PPO | Source: Home / Self Care | Attending: Cardiovascular Disease

## 2014-02-20 DIAGNOSIS — I2510 Atherosclerotic heart disease of native coronary artery without angina pectoris: Secondary | ICD-10-CM

## 2014-02-20 DIAGNOSIS — I2109 ST elevation (STEMI) myocardial infarction involving other coronary artery of anterior wall: Secondary | ICD-10-CM | POA: Diagnosis not present

## 2014-02-20 DIAGNOSIS — I959 Hypotension, unspecified: Secondary | ICD-10-CM | POA: Diagnosis not present

## 2014-02-20 DIAGNOSIS — E785 Hyperlipidemia, unspecified: Secondary | ICD-10-CM

## 2014-02-20 DIAGNOSIS — R079 Chest pain, unspecified: Secondary | ICD-10-CM | POA: Diagnosis not present

## 2014-02-20 DIAGNOSIS — I219 Acute myocardial infarction, unspecified: Secondary | ICD-10-CM

## 2014-02-20 DIAGNOSIS — I10 Essential (primary) hypertension: Secondary | ICD-10-CM

## 2014-02-20 DIAGNOSIS — J9600 Acute respiratory failure, unspecified whether with hypoxia or hypercapnia: Secondary | ICD-10-CM | POA: Diagnosis not present

## 2014-02-20 DIAGNOSIS — I5041 Acute combined systolic (congestive) and diastolic (congestive) heart failure: Secondary | ICD-10-CM | POA: Diagnosis not present

## 2014-02-20 DIAGNOSIS — I517 Cardiomegaly: Secondary | ICD-10-CM

## 2014-02-20 HISTORY — PX: LEFT HEART CATH: SHX5478

## 2014-02-20 LAB — BASIC METABOLIC PANEL
BUN: 14 mg/dL (ref 6–23)
CALCIUM: 9.5 mg/dL (ref 8.4–10.5)
CO2: 26 mEq/L (ref 19–32)
Chloride: 98 mEq/L (ref 96–112)
Creatinine, Ser: 1.12 mg/dL (ref 0.50–1.35)
GFR, EST AFRICAN AMERICAN: 83 mL/min — AB (ref >90)
GFR, EST NON AFRICAN AMERICAN: 72 mL/min — AB (ref >90)
Glucose, Bld: 121 mg/dL — ABNORMAL HIGH (ref 70–99)
POTASSIUM: 3.7 meq/L (ref 3.7–5.3)
SODIUM: 140 meq/L (ref 137–147)

## 2014-02-20 SURGERY — LEFT HEART CATH
Anesthesia: LOCAL

## 2014-02-20 MED ORDER — VERAPAMIL HCL 2.5 MG/ML IV SOLN
INTRAVENOUS | Status: AC
  Filled 2014-02-20: qty 2

## 2014-02-20 MED ORDER — HEPARIN (PORCINE) IN NACL 2-0.9 UNIT/ML-% IJ SOLN
INTRAMUSCULAR | Status: AC
  Filled 2014-02-20: qty 1000

## 2014-02-20 MED ORDER — SODIUM CHLORIDE 0.9 % IV SOLN: 250.0000 mL | INTRAVENOUS | Status: DC | PRN

## 2014-02-20 MED ORDER — FENTANYL CITRATE 0.05 MG/ML IJ SOLN
INTRAMUSCULAR | Status: AC
  Filled 2014-02-20: qty 2

## 2014-02-20 MED ORDER — MIDAZOLAM HCL 2 MG/2ML IJ SOLN
INTRAMUSCULAR | Status: AC
  Filled 2014-02-20: qty 2

## 2014-02-20 MED ORDER — ASPIRIN 81 MG PO CHEW
CHEWABLE_TABLET | ORAL | Status: AC
  Filled 2014-02-20: qty 1

## 2014-02-20 MED ORDER — SODIUM CHLORIDE 0.9 % IJ SOLN
3.0000 mL | Freq: Two times a day (BID) | INTRAMUSCULAR | Status: DC
  Administered 2014-02-20: 3 mL via INTRAVENOUS

## 2014-02-20 MED ORDER — ACETAMINOPHEN 325 MG PO TABS: 650.0000 mg | ORAL_TABLET | ORAL | Status: DC | PRN

## 2014-02-20 MED ORDER — ONDANSETRON HCL 4 MG/2ML IJ SOLN: 4.0000 mg | Freq: Four times a day (QID) | INTRAMUSCULAR | Status: DC | PRN

## 2014-02-20 MED ORDER — CARVEDILOL 3.125 MG PO TABS
3.1250 mg | ORAL_TABLET | Freq: Two times a day (BID) | ORAL | Status: DC
  Administered 2014-02-20 – 2014-02-25 (×10): 3.125 mg via ORAL
  Filled 2014-02-20 (×14): qty 1

## 2014-02-20 MED ORDER — POTASSIUM CHLORIDE ER 10 MEQ PO TBCR
20.0000 meq | EXTENDED_RELEASE_TABLET | Freq: Once | ORAL | Status: AC
  Administered 2014-02-20: 20 meq via ORAL
  Filled 2014-02-20: qty 2

## 2014-02-20 MED ORDER — HEPARIN SODIUM (PORCINE) 1000 UNIT/ML IJ SOLN
INTRAMUSCULAR | Status: AC
  Filled 2014-02-20: qty 1

## 2014-02-20 MED ORDER — SODIUM CHLORIDE 0.9 % IJ SOLN: 3.0000 mL | INTRAMUSCULAR | Status: DC | PRN

## 2014-02-20 MED ORDER — LIDOCAINE HCL (PF) 1 % IJ SOLN
INTRAMUSCULAR | Status: AC
  Filled 2014-02-20: qty 30

## 2014-02-20 MED ORDER — HEART ATTACK BOUNCING BOOK
Freq: Once | Status: AC
  Administered 2014-02-20: 1
  Filled 2014-02-20: qty 1

## 2014-02-20 MED ORDER — SODIUM CHLORIDE 0.9 % IV SOLN
INTRAVENOUS | Status: AC
  Administered 2014-02-20: 10:00:00 via INTRAVENOUS

## 2014-02-20 MED ORDER — NITROGLYCERIN 0.2 MG/ML ON CALL CATH LAB
INTRAVENOUS | Status: AC
  Filled 2014-02-20: qty 1

## 2014-02-20 MED ORDER — ASPIRIN 81 MG PO CHEW
81.0000 mg | CHEWABLE_TABLET | Freq: Once | ORAL | Status: AC
  Administered 2014-02-20: 81 mg via ORAL

## 2014-02-20 NOTE — Progress Notes (Signed)
  Echocardiogram 2D Echocardiogram has been performed.  Arvil Chaco 02/20/2014, 3:33 PM

## 2014-02-20 NOTE — Interval H&P Note (Signed)
History and Physical Interval Note:  02/20/2014 9:31 AM  Austin Allen  has presented today for surgery, with the diagnosis of Stemi  The various methods of treatment have been discussed with the patient and family. After consideration of risks, benefits and other options for treatment, the patient has consented to  Procedure(s): LEFT HEART CATH (N/A) as a surgical intervention .  The patient's history has been reviewed, patient examined, no change in status, stable for surgery.  I have reviewed the patient's chart and labs.  Questions were answered to the patient's satisfaction.    Cath Lab Visit (complete for each Cath Lab visit)  Clinical Evaluation Leading to the Procedure:   ACS: yes  Non-ACS:    Anginal Classification: CCS IV  Anti-ischemic medical therapy: No Therapy  Non-Invasive Test Results: No non-invasive testing performed  Prior CABG: No previous CABG       [name]

## 2014-02-20 NOTE — H&P (View-Only) (Signed)
Patient: Austin Allen / Admit Date: 02/18/2014 / Date of Encounter: 02/20/2014, 7:34 AM  Subjective   Called to see patient due to critical EKG - NSR but with significant ST elevation most profound up to 6mm in V4. Pt actually feels better this AM and denies CP, SOB. Did not sleep well - restless in bed because he's taller than it. Back on nasal cannula.  Objective   Telemetry: NSR  Physical Exam: Blood pressure 114/74, pulse 88, temperature 100 F (37.8 C), temperature source Oral, resp. rate 25, height 6\' 4"  (1.93 m), weight 227 lb 4.7 oz (103.1 kg), SpO2 98.00%. General: Well developed, well nourished comfortable appearing AAM in no acute distress. Head: Normocephalic, atraumatic, sclera non-icteric, no xanthomas, nares are without discharge. Neck: JVP not elevated. Lungs: Clear bilaterally to auscultation without wheezes, rales, or rhonchi. Breathing is unlabored. Heart: RRR S1 S2 without murmurs, rubs, or gallops.  Abdomen: Soft, non-tender, non-distended with normoactive bowel sounds. No rebound/guarding. Extremities: No clubbing or cyanosis. No edema. Distal pedal pulses are 2+ and equal bilaterally. Neuro: Alert and oriented X 3. Moves all extremities spontaneously. Psych:  Responds to questions appropriately with a normal affect.   Intake/Output Summary (Last 24 hours) at 02/20/14 0734 Last data filed at 02/20/14 0600  Gross per 24 hour  Intake 1143.9 ml  Output   6150 ml  Net -5006.1 ml    Inpatient Medications:  . aspirin EC  81 mg Oral Daily  . atorvastatin  80 mg Oral q1800  . enalapril  5 mg Oral BID  . furosemide  40 mg Intravenous 3 times per day  . heart attack bouncing book   Does not apply Once  . heparin  5,000 Units Subcutaneous 3 times per day  . influenza vac split quadrivalent PF  0.5 mL Intramuscular Tomorrow-1000  . pneumococcal 23 valent vaccine  0.5 mL Intramuscular Tomorrow-1000  . potassium chloride  20 mEq Oral BID  . sodium chloride  3 mL  Intravenous Q12H  . spironolactone  12.5 mg Oral Daily  . Ticagrelor  90 mg Oral BID   Infusions:    Labs:  Recent Labs  02/19/14 0240 02/20/14 0328  NA 143 140  K 3.9 3.7  CL 105 98  CO2 24 26  GLUCOSE 107* 121*  BUN 16 14  CREATININE 0.96 1.12  CALCIUM 8.8 9.5    Recent Labs  02/19/14 0005  02/19/14 0240 02/19/14 1025  WBC 8.4  --  8.0 12.1*  NEUTROABS 3.5  --   --   --   HGB 15.3  < > 16.4 18.2*  HCT 46.0  < > 50.4 54.6*  MCV 87.8  --  88.6 87.5  PLT 180  --  170 170  < > = values in this interval not displayed.  Recent Labs  02/19/14 0005 02/19/14 0240 02/19/14 1025 02/19/14 1615  TROPONINI <0.30 >20.00* >20.00* >20.00*    Recent Labs  02/19/14 0240  HGBA1C 6.1*     Radiology/Studies:  Dg Chest Port 1 View 02/19/2014   CLINICAL DATA:  SOB  EXAM: PORTABLE CHEST - 1 VIEW  COMPARISON:  None.  FINDINGS: The cardiac silhouette is enlarged. Diffuse bilateral pulmonary opacities are appreciated. There is prominence of the interstitial markings, peribronchial cuffing, and vascular indistinctness. The osseous structures are unremarkable. No focal regions of consolidation are appreciated.  IMPRESSION: Diffuse interstitial infiltrate. Differential considerations pulmonary edema, versus an infectious or inflammatory interstitial infiltrate. Surveillance evaluation recommended.   Electronically Signed  By: Salome Holmes M.D.   On: 02/19/2014 08:08    Assessment and Plan  1. Acute anterolateral STEMI/CAD s/p Xience DES to 100% LAD, residual 80% pRCA, moderate 50% Cx disease per cath but Dr. Donnie Aho note from yesterday evening mentions whether this may be more severe than noted. EKG this AM with worsened ST elevation. Discussed acutely with Dr. Gala Romney - see below for further thoughts. 2. Acute respiratory failure secondary to acute diastolic CHF due to #1 (LVEDP 39 by cath), suspect worsening LV function - echo pending. Weight down ? 24lbs (question accuracy), -  5L. 3. HTN - BP improved this AM. 4. Hyperglycemia - A1C 6.1. 5. Hyperlipidemia - continue high dose statin. 6. Low grade temp - follow, may be related to MI. Repeat CXR pending. Check UA.   Signed, Ronie Spies PA-C  Patient seen and examined with Ronie Spies, PA-C. We discussed all aspects of the encounter. I agree with the assessment and plan as stated above. Patient actually looks much better than yesterday. Respiratory status much improved. Denies CP but ECG very concerning for stent thrombosis. We repeated tracing and was similar. D/w Dr. Excell Seltzer. I think we need to take him back for relook to ensure stent patency. Await echo as well to reassess LV function. Weight down ~20 pounds. Will hold lasix for now.   Daniel Bensimhon,MD 8:09 AM

## 2014-02-20 NOTE — Progress Notes (Signed)
EKG CRITICAL VALUE     12 lead EKG performed.  Critical value noted.Pierre Bali , RN notified.   Gerome Kokesh H, CCT 02/20/2014 7:21 AM

## 2014-02-20 NOTE — Progress Notes (Signed)
Education provided to patient re: heart attack(symptoms/when to notify MD/medications ordered/stent procedure/lifestyle modifications). Patient verbalized understanding, however will need reinforcement. Patient provided with Brilinta Patient Starter Kit. Heart Attack Bouncing Back booklet ordered for patient.

## 2014-02-20 NOTE — CV Procedure (Signed)
    Cardiac Catheterization Procedure Note  Name: [name] MRN: [id number] DOB: [birth year]  Procedure: Left Heart Cath, Selective Coronary Angiography, LV angiography  Indication: Recurrent ST elevation after STEMI and primary PCI 3/21.    Procedural Details: The right wrist was prepped, draped, and anesthetized with 1% lidocaine. Using the modified Seldinger technique, a 5 French sheath was introduced into the right radial artery. 3 mg of verapamil was administered through the sheath, weight-based unfractionated heparin was administered intravenously. Standard Judkins catheters were used for selective coronary angiography and left ventriculography. Catheter exchanges were performed over an exchange length guidewire. There were no immediate procedural complications. A TR band was used for radial hemostasis at the completion of the procedure.  The patient was transferred to the post catheterization recovery area for further monitoring.  Procedural Findings: Hemodynamics: AO 100/77 LV 103/8  Coronary angiography: Coronary dominance: right  Left mainstem: Widely patent  Left anterior descending (LAD): Patent proximal LAD stent without stenosis. Large wraparound LAD. Large first diagonal. There is slow flow into the apical portion of the LAD. No significant stenosis throughout.  Left circumflex (LCx): Large, dominant vessel. Widely patent in the proximal vessel. First OM is patent. Just after the first OM there is 70-75% stenosis in the mid-circumflex. The 2nd OM and left PDA branches are patent.  Right coronary artery (RCA): nondominant vessel, 75% proximal stenosis.  Left ventriculography: Left ventricular systolic function is moderately to severely depressed. There is severe hypokinesis from the mid-anterior wall through the peri-apical region. The estimated LVEF is 35%.   Final Conclusions:   1. Continued patency of the proximal LAD stent 2. Moderate-severe mid-LCx stenosis  (dominant vessel) 3. Moderate-severe RCA stenosis (nondominant vessel) 4. Severe segmental LV dysfunction   Recommendations: suspect ST elevation related to poor myocardial tissue perfusion after STEMI. The prox LAD stent is widely patent but flow into the distal LAD is slow. Would treat residual CAD medically but should have stress perfusion study in about 4-6 weeks depending on his recovery. Await 2D echo for assessment of LV function - seems borderline for consideration of LifeVest by ventriculography.  Austin Allen 02/20/2014, 9:34 AM

## 2014-02-20 NOTE — Progress Notes (Signed)
Patient: Austin Allen / Admit Date: 02/18/2014 / Date of Encounter: 02/20/2014, 7:34 AM  Subjective   Called to see patient due to critical EKG - NSR but with significant ST elevation most profound up to 6mm in V4. Pt actually feels better this AM and denies CP, SOB. Did not sleep well - restless in bed because he's taller than it. Back on nasal cannula.  Objective   Telemetry: NSR  Physical Exam: Blood pressure 114/74, pulse 88, temperature 100 F (37.8 C), temperature source Oral, resp. rate 25, height 6' 4" (1.93 m), weight 227 lb 4.7 oz (103.1 kg), SpO2 98.00%. General: Well developed, well nourished comfortable appearing AAM in no acute distress. Head: Normocephalic, atraumatic, sclera non-icteric, no xanthomas, nares are without discharge. Neck: JVP not elevated. Lungs: Clear bilaterally to auscultation without wheezes, rales, or rhonchi. Breathing is unlabored. Heart: RRR S1 S2 without murmurs, rubs, or gallops.  Abdomen: Soft, non-tender, non-distended with normoactive bowel sounds. No rebound/guarding. Extremities: No clubbing or cyanosis. No edema. Distal pedal pulses are 2+ and equal bilaterally. Neuro: Alert and oriented X 3. Moves all extremities spontaneously. Psych:  Responds to questions appropriately with a normal affect.   Intake/Output Summary (Last 24 hours) at 02/20/14 0734 Last data filed at 02/20/14 0600  Gross per 24 hour  Intake 1143.9 ml  Output   6150 ml  Net -5006.1 ml    Inpatient Medications:  . aspirin EC  81 mg Oral Daily  . atorvastatin  80 mg Oral q1800  . enalapril  5 mg Oral BID  . furosemide  40 mg Intravenous 3 times per day  . heart attack bouncing book   Does not apply Once  . heparin  5,000 Units Subcutaneous 3 times per day  . influenza vac split quadrivalent PF  0.5 mL Intramuscular Tomorrow-1000  . pneumococcal 23 valent vaccine  0.5 mL Intramuscular Tomorrow-1000  . potassium chloride  20 mEq Oral BID  . sodium chloride  3 mL  Intravenous Q12H  . spironolactone  12.5 mg Oral Daily  . Ticagrelor  90 mg Oral BID   Infusions:    Labs:  Recent Labs  02/19/14 0240 02/20/14 0328  NA 143 140  K 3.9 3.7  CL 105 98  CO2 24 26  GLUCOSE 107* 121*  BUN 16 14  CREATININE 0.96 1.12  CALCIUM 8.8 9.5    Recent Labs  02/19/14 0005  02/19/14 0240 02/19/14 1025  WBC 8.4  --  8.0 12.1*  NEUTROABS 3.5  --   --   --   HGB 15.3  < > 16.4 18.2*  HCT 46.0  < > 50.4 54.6*  MCV 87.8  --  88.6 87.5  PLT 180  --  170 170  < > = values in this interval not displayed.  Recent Labs  02/19/14 0005 02/19/14 0240 02/19/14 1025 02/19/14 1615  TROPONINI <0.30 >20.00* >20.00* >20.00*    Recent Labs  02/19/14 0240  HGBA1C 6.1*     Radiology/Studies:  Dg Chest Port 1 View 02/19/2014   CLINICAL DATA:  SOB  EXAM: PORTABLE CHEST - 1 VIEW  COMPARISON:  None.  FINDINGS: The cardiac silhouette is enlarged. Diffuse bilateral pulmonary opacities are appreciated. There is prominence of the interstitial markings, peribronchial cuffing, and vascular indistinctness. The osseous structures are unremarkable. No focal regions of consolidation are appreciated.  IMPRESSION: Diffuse interstitial infiltrate. Differential considerations pulmonary edema, versus an infectious or inflammatory interstitial infiltrate. Surveillance evaluation recommended.   Electronically Signed     By: Salome Holmes M.D.   On: 02/19/2014 08:08    Assessment and Plan  1. Acute anterolateral STEMI/CAD s/p Xience DES to 100% LAD, residual 80% pRCA, moderate 50% Cx disease per cath but Dr. Donnie Aho note from yesterday evening mentions whether this may be more severe than noted. EKG this AM with worsened ST elevation. Discussed acutely with Dr. Gala Romney - see below for further thoughts. 2. Acute respiratory failure secondary to acute diastolic CHF due to #1 (LVEDP 39 by cath), suspect worsening LV function - echo pending. Weight down ? 24lbs (question accuracy), -  5L. 3. HTN - BP improved this AM. 4. Hyperglycemia - A1C 6.1. 5. Hyperlipidemia - continue high dose statin. 6. Low grade temp - follow, may be related to MI. Repeat CXR pending. Check UA.   Signed, Ronie Spies PA-C  Patient seen and examined with Ronie Spies, PA-C. We discussed all aspects of the encounter. I agree with the assessment and plan as stated above. Patient actually looks much better than yesterday. Respiratory status much improved. Denies CP but ECG very concerning for stent thrombosis. We repeated tracing and was similar. D/w Dr. Excell Seltzer. I think we need to take him back for relook to ensure stent patency. Await echo as well to reassess LV function. Weight down ~20 pounds. Will hold lasix for now.   Xenia Nile,MD 8:09 AM

## 2014-02-20 NOTE — Progress Notes (Signed)
Pt with routine AM EKG completed; significant ST changes noted; PA and MD aware; orders received; pt NPO and consent signed for cardiac cath and possible PCI; questions encouraged and answered; pt denies pain or discomfort at this time; pt transferred to CCL c RN and telemetry

## 2014-02-21 DIAGNOSIS — I5041 Acute combined systolic (congestive) and diastolic (congestive) heart failure: Secondary | ICD-10-CM

## 2014-02-21 DIAGNOSIS — I219 Acute myocardial infarction, unspecified: Secondary | ICD-10-CM

## 2014-02-21 DIAGNOSIS — I32 Pericarditis in diseases classified elsewhere: Secondary | ICD-10-CM

## 2014-02-21 LAB — HEPATIC FUNCTION PANEL
ALBUMIN: 3 g/dL — AB (ref 3.5–5.2)
ALK PHOS: 76 U/L (ref 39–117)
ALT: 66 U/L — ABNORMAL HIGH (ref 0–53)
AST: 99 U/L — AB (ref 0–37)
Bilirubin, Direct: <0.2 mg/dL (ref 0.0–0.3)
TOTAL PROTEIN: 7.2 g/dL (ref 6.0–8.3)
Total Bilirubin: 0.9 mg/dL (ref 0.3–1.2)

## 2014-02-21 LAB — CBC
HCT: 51.6 % (ref 39.0–52.0)
Hemoglobin: 17.9 g/dL — ABNORMAL HIGH (ref 13.0–17.0)
MCH: 29.4 pg (ref 26.0–34.0)
MCHC: 34.7 g/dL (ref 30.0–36.0)
MCV: 84.7 fL (ref 78.0–100.0)
Platelets: 183 10*3/uL (ref 150–400)
RBC: 6.09 MIL/uL — ABNORMAL HIGH (ref 4.22–5.81)
RDW: 14 % (ref 11.5–15.5)
WBC: 11.5 10*3/uL — ABNORMAL HIGH (ref 4.0–10.5)

## 2014-02-21 LAB — BASIC METABOLIC PANEL
BUN: 14 mg/dL (ref 6–23)
CHLORIDE: 99 meq/L (ref 96–112)
CO2: 24 mEq/L (ref 19–32)
Calcium: 9 mg/dL (ref 8.4–10.5)
Creatinine, Ser: 1.04 mg/dL (ref 0.50–1.35)
GFR calc Af Amer: >90 mL/min (ref >90)
GFR, EST NON AFRICAN AMERICAN: 78 mL/min — AB (ref >90)
GLUCOSE: 105 mg/dL — AB (ref 70–99)
POTASSIUM: 3.7 meq/L (ref 3.7–5.3)
SODIUM: 137 meq/L (ref 137–147)

## 2014-02-21 LAB — URINALYSIS, ROUTINE W REFLEX MICROSCOPIC
Bilirubin Urine: NEGATIVE
Glucose, UA: NEGATIVE mg/dL
HGB URINE DIPSTICK: NEGATIVE
Ketones, ur: NEGATIVE mg/dL
LEUKOCYTES UA: NEGATIVE
NITRITE: NEGATIVE
PROTEIN: NEGATIVE mg/dL
SPECIFIC GRAVITY, URINE: 1.026 (ref 1.005–1.030)
UROBILINOGEN UA: 1 mg/dL (ref 0.0–1.0)
pH: 6.5 (ref 5.0–8.0)

## 2014-02-21 MED ORDER — FUROSEMIDE 40 MG PO TABS
40.0000 mg | ORAL_TABLET | Freq: Every day | ORAL | Status: DC
  Administered 2014-02-21 – 2014-02-22 (×2): 40 mg via ORAL
  Filled 2014-02-21 (×2): qty 1

## 2014-02-21 MED ORDER — IRBESARTAN 150 MG PO TABS
150.0000 mg | ORAL_TABLET | Freq: Every day | ORAL | Status: DC
  Administered 2014-02-21 – 2014-02-22 (×2): 150 mg via ORAL
  Filled 2014-02-21 (×2): qty 1

## 2014-02-21 MED FILL — Sodium Chloride IV Soln 0.9%: INTRAVENOUS | Qty: 50 | Status: AC

## 2014-02-21 NOTE — Progress Notes (Signed)
Patient Name: Austin Allen Date of Encounter: 02/21/2014  Active Problems:   ST elevation myocardial infarction (STEMI) of anterolateral wall, initial episode of care   Acute MI, anterolateral wall, initial episode of care   Acute respiratory failure   Acute diastolic HF (heart failure)   HTN (hypertension)   Hyperlipidemia   Length of Stay: 3  SUBJECTIVE  No angina. Has a dry, nagging cough which associates mild chest discomfort. No dyspnea lying flat. Relook at stent showed no problem.  EF 25-20%, trivial pericardial effusion  CURRENT MEDS . aspirin EC  81 mg Oral Daily  . atorvastatin  80 mg Oral q1800  . carvedilol  3.125 mg Oral BID WC  . heparin  5,000 Units Subcutaneous 3 times per day  . influenza vac split quadrivalent PF  0.5 mL Intramuscular Tomorrow-1000  . irbesartan  150 mg Oral Daily  . pneumococcal 23 valent vaccine  0.5 mL Intramuscular Tomorrow-1000  . sodium chloride  3 mL Intravenous Q12H  . spironolactone  12.5 mg Oral Daily  . Ticagrelor  90 mg Oral BID    OBJECTIVE   Intake/Output Summary (Last 24 hours) at 02/21/14 0940 Last data filed at 02/21/14 0929  Gross per 24 hour  Intake   1886 ml  Output   2050 ml  Net   -164 ml   Filed Weights   02/19/14 0200 02/20/14 0423 02/21/14 0500  Weight: 114.2 kg (251 lb 12.3 oz) 103.1 kg (227 lb 4.7 oz) 101.7 kg (224 lb 3.3 oz)    PHYSICAL EXAM Filed Vitals:   02/21/14 0738 02/21/14 0800 02/21/14 0852 02/21/14 0900  BP: 101/63  110/70 93/37  Pulse: 85 83 100 95  Temp: 97.9 F (36.6 C)     TempSrc: Oral     Resp: 24 16  18   Height:      Weight:      SpO2: 99% 100%  100%   General: Alert, oriented x3, no distress Head: no evidence of trauma, PERRL, EOMI, no exophtalmos or lid lag, no myxedema, no xanthelasma; normal ears, nose and oropharynx Neck: normal jugular venous pulsations and no hepatojugular reflux; brisk carotid pulses without delay and no carotid bruits Chest: clear to auscultation,  no signs of consolidation by percussion or palpation, normal fremitus, symmetrical and full respiratory excursions Cardiovascular: normal position and quality of the apical impulse, regular rhythm, normal first and second heart sounds, no rubs, +ve S$ gallop, no murmur Abdomen: no tenderness or distention, no masses by palpation, no abnormal pulsatility or arterial bruits, normal bowel sounds, no hepatosplenomegaly Extremities: no clubbing, cyanosis or edema; 2+ radial, ulnar and brachial pulses bilaterally; 2+ right femoral, posterior tibial and dorsalis pedis pulses; 2+ left femoral, posterior tibial and dorsalis pedis pulses; no subclavian or femoral bruits Neurological: grossly nonfocal  LABS  CBC  Recent Labs  02/19/14 0005  02/19/14 1025 02/21/14 0340  WBC 8.4  < > 12.1* 11.5*  NEUTROABS 3.5  --   --   --   HGB 15.3  < > 18.2* 17.9*  HCT 46.0  < > 54.6* 51.6  MCV 87.8  < > 87.5 84.7  PLT 180  < > 170 183  < > = values in this interval not displayed. Basic Metabolic Panel  Recent Labs  02/20/14 0328 02/21/14 0340  NA 140 137  K 3.7 3.7  CL 98 99  CO2 26 24  GLUCOSE 121* 105*  BUN 14 14  CREATININE 1.12 1.04  CALCIUM 9.5 9.0  Liver Function Tests  Recent Labs  02/21/14 0340  AST 99*  ALT 66*  ALKPHOS 76  BILITOT 0.9  PROT 7.2  ALBUMIN 3.0*   No results found for this basename: LIPASE, AMYLASE,  in the last 72 hours Cardiac Enzymes  Recent Labs  02/19/14 0240 02/19/14 1025 02/19/14 1615  TROPONINI >20.00* >20.00* >20.00*   BNP No components found with this basename: POCBNP,  D-Dimer No results found for this basename: DDIMER,  in the last 72 hours Hemoglobin A1C  Recent Labs  02/19/14 0240  HGBA1C 6.1*   Fasting Lipid Panel  Recent Labs  02/19/14 0240  CHOL 164  HDL 40  LDLCALC 114*  TRIG 49  CHOLHDL 4.1   Thyroid Function Tests  Recent Labs  02/19/14 0240  TSH 0.523    Radiology Studies Imaging results have been reviewed  and Dg Chest Port 1 View  02/20/2014   CLINICAL DATA:  CHF  EXAM: PORTABLE CHEST - 1 VIEW  COMPARISON:  Portable exam 0719 hr compared to 02/19/2014  FINDINGS: Upper normal heart size.  Mediastinal contours and pulmonary vascularity normal.  Improved pulmonary infiltrates, with greatest residual infiltrate located in right upper lobe.  No pleural effusion or pneumothorax.  Bones unremarkable.  IMPRESSION: Improved pulmonary infiltrates.   Electronically Signed   By: Ulyses SouthwardMark  Boles M.D.   On: 02/20/2014 11:09    TELE NSR, no VT  ECG NSR, loss of R waves V1-V4, >4 mm ST elevation across anterior precordium  ASSESSMENT AND PLAN Suspect postinfarction acute pericarditis. No sign of tamponade. Stop ACEi (cough) and use ARB instead. Oral loop diuretics - E/e' ratio borderline high for elevated filling pressures. Life Vest referral for EF around 25% post anterior MI (also note slow flow in LAD and 3 sequential Trop I > 20 that suggest extensive permanent injury). Reevaluate EF in 3 months to decide re: ICD. Transfer to stepdown. SW consult to help with work/short term disability, etc. 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

## 2014-02-21 NOTE — Progress Notes (Signed)
CARDIAC REHAB PHASE I   PRE:  Rate/Rhythm: 85 SR    BP: sitting 97/56    SaO2: 94 RA  MODE:  Ambulation: 250 ft   POST:  Rate/Rhythm: 112 sT    BP: sitting 109/61     SaO2: 92 RA  Tolerated fairly well.  No c/o, denied sx. To recliner with VSS. HR 112 ST. Began ed, pt receptive. Left materials for pt to read, also videos for RN to set up. Will f/u in am. [id number]  Elissa Lovett Volente CES, ACSM 02/21/2014 11:10 AM

## 2014-02-21 NOTE — Care Management Note (Addendum)
  Page 2 of 2   02/28/2014     2:00:27 PM   CARE MANAGEMENT NOTE 02/28/2014  Patient:  Austin Allen, Austin Allen   Account Number:  [id number]  Date Initiated:  02/21/2014  Documentation initiated by:  Junius Creamer  Subjective/Objective Assessment:   adm w mi     Action/Plan:   lives alone, works at [employer] ins. pt has been given brilinta 30day free and copay assist card.   Anticipated DC Date:     Anticipated DC Plan:  HOME/SELF CARE      DC Planning Services  CM consult  Medication Assistance      Choice offered to / List presented to:             Status of service:  Completed, signed off Medicare Important Message given?   (If response is "NO", the following Medicare IM given date fields will be blank) Date Medicare IM given:   Date Additional Medicare IM given:    Discharge Disposition:  HOME/SELF CARE  Per UR Regulation:  Reviewed for med. necessity/level of care/duration of stay  If discussed at Long Length of Stay Meetings, dates discussed:   02/24/2014    Comments:  02/23/14 Disposition Plan: Home with Life Vest (initiated) Devonna Oboyle RN, BSN, [place], CCM 02/23/2014  3/24  0934 debbie dowell rn,bsn went over brilinta cards for 30day free and copay assist card. hosp does not assist w disability thru employers. spoke w pt to call his employer when disch and get them to mail him paperwork.  3/23 1033a debbie dowell rn,bsn spoke w nse. pt has brilinta 30day card and copay assist card. have left vm for [employer] ins verifyer that pt has [employer] ins. his copay assist card should keep copay reasonable even if [employer] copay is higher copay card will bring down to 18.00 per month.

## 2014-02-22 LAB — BASIC METABOLIC PANEL
BUN: 20 mg/dL (ref 6–23)
CALCIUM: 8.8 mg/dL (ref 8.4–10.5)
CO2: 22 meq/L (ref 19–32)
CREATININE: 1.17 mg/dL (ref 0.50–1.35)
Chloride: 98 mEq/L (ref 96–112)
GFR calc Af Amer: 79 mL/min — ABNORMAL LOW (ref >90)
GFR calc non Af Amer: 68 mL/min — ABNORMAL LOW (ref >90)
GLUCOSE: 147 mg/dL — AB (ref 70–99)
Potassium: 3.5 mEq/L — ABNORMAL LOW (ref 3.7–5.3)
Sodium: 137 mEq/L (ref 137–147)

## 2014-02-22 MED ORDER — IRBESARTAN 75 MG PO TABS
75.0000 mg | ORAL_TABLET | Freq: Every day | ORAL | Status: DC
  Administered 2014-02-23 – 2014-02-25 (×3): 75 mg via ORAL
  Filled 2014-02-22 (×3): qty 1

## 2014-02-22 MED ORDER — FUROSEMIDE 20 MG PO TABS
20.0000 mg | ORAL_TABLET | Freq: Every day | ORAL | Status: DC
  Administered 2014-02-23 – 2014-02-25 (×3): 20 mg via ORAL
  Filled 2014-02-22 (×3): qty 1

## 2014-02-22 NOTE — Progress Notes (Signed)
CARDIAC REHAB PHASE I   PRE:  Rate/Rhythm: 91SR  BP:  Supine: 87/52, 86/52  Sitting: 100/51  Standing:    SaO2:   MODE:  Ambulation: 700 ft   POST:  Rate/Rhythm: 116 ST  BP:  Supine:   Sitting: 105/62  Standing:    SaO2:  1455-1517 Pt walked 700 ft with steady gait. Denied CP or dizziness during walk. Was a little dizzy when he first sat on side of bed. Answered pt's questions re diet. He likes to eat at buffets. Discussed that he is going to have to watch salt. Gave CHF booklet. Put on lifevest video for pt to watch. Will continue ed tomorrow.   Luetta Nutting, RN BSN  02/22/2014 3:13 PM

## 2014-02-22 NOTE — Progress Notes (Addendum)
  Patient Name: Austin Allen Date of Encounter: 02/22/2014  Active Problems:   ST elevation myocardial infarction (STEMI) of anterolateral wall, initial episode of care   Acute MI, anterolateral wall, initial episode of care   Acute respiratory failure   Acute diastolic HF (heart failure)   HTN (hypertension)   Hyperlipidemia   Length of Stay: 4  SUBJECTIVE  Walked a lot, asymptomatic. BP dips into 70s-80s after meds, but no complaints. Watching LifeVest video.  CURRENT MEDS . aspirin EC  81 mg Oral Daily  . atorvastatin  80 mg Oral q1800  . carvedilol  3.125 mg Oral BID WC  . furosemide  40 mg Oral Daily  . heparin  5,000 Units Subcutaneous 3 times per day  . influenza vac split quadrivalent PF  0.5 mL Intramuscular Tomorrow-1000  . irbesartan  150 mg Oral Daily  . pneumococcal 23 valent vaccine  0.5 mL Intramuscular Tomorrow-1000  . sodium chloride  3 mL Intravenous Q12H  . spironolactone  12.5 mg Oral Daily  . Ticagrelor  90 mg Oral BID    OBJECTIVE   Intake/Output Summary (Last 24 hours) at 02/22/14 1523 Last data filed at 02/22/14 1200  Gross per 24 hour  Intake   1206 ml  Output      0 ml  Net   1206 ml   Filed Weights   02/20/14 0423 02/21/14 0500 02/22/14 0500  Weight: 103.1 kg (227 lb 4.7 oz) 101.7 kg (224 lb 3.3 oz) 104.01 kg (229 lb 4.8 oz)    PHYSICAL EXAM Filed Vitals:   02/22/14 0800 02/22/14 0934 02/22/14 1145 02/22/14 1205  BP: 107/60 95/60  93/46  Pulse:      Temp:   99 F (37.2 C)   TempSrc:   Oral   Resp:    16  Height:      Weight:      SpO2:   98%    General: Alert, oriented x3, no distress  LABS  CBC  Recent Labs  02/21/14 0340  WBC 11.5*  HGB 17.9*  HCT 51.6  MCV 84.7  PLT 183   Basic Metabolic Panel  Recent Labs  02/21/14 0340 02/22/14 0322  NA 137 137  K 3.7 3.5*  CL 99 98  CO2 24 22  GLUCOSE 105* 147*  BUN 14 20  CREATININE 1.04 1.17  CALCIUM 9.0 8.8   Liver Function Tests  Recent Labs  02/21/14 0340   AST 99*  ALT 66*  ALKPHOS 76  BILITOT 0.9  PROT 7.2  ALBUMIN 3.0*   No results found for this basename: LIPASE, AMYLASE,  in the last 72 hours Cardiac Enzymes  Recent Labs  02/19/14 1615  TROPONINI >20.00*   Radiology Studies Imaging results have been reviewed and No results found.  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 No arrhythmia, but prime candidate for LifeVest. Transfer telemetry. Possible DC tomorrow.   Thurmon Fair, MD, [facility] [phone] office [phone] pager 02/22/2014 3:23 PM

## 2014-02-23 ENCOUNTER — Other Ambulatory Visit: Payer: Self-pay

## 2014-02-23 DIAGNOSIS — I5041 Acute combined systolic (congestive) and diastolic (congestive) heart failure: Secondary | ICD-10-CM | POA: Diagnosis not present

## 2014-02-23 DIAGNOSIS — I959 Hypotension, unspecified: Secondary | ICD-10-CM | POA: Diagnosis not present

## 2014-02-23 DIAGNOSIS — I255 Ischemic cardiomyopathy: Secondary | ICD-10-CM | POA: Insufficient documentation

## 2014-02-23 DIAGNOSIS — I2589 Other forms of chronic ischemic heart disease: Secondary | ICD-10-CM

## 2014-02-23 DIAGNOSIS — F17200 Nicotine dependence, unspecified, uncomplicated: Secondary | ICD-10-CM

## 2014-02-23 DIAGNOSIS — I2109 ST elevation (STEMI) myocardial infarction involving other coronary artery of anterior wall: Secondary | ICD-10-CM | POA: Diagnosis not present

## 2014-02-23 DIAGNOSIS — Z720 Tobacco use: Secondary | ICD-10-CM | POA: Diagnosis present

## 2014-02-23 DIAGNOSIS — I2510 Atherosclerotic heart disease of native coronary artery without angina pectoris: Secondary | ICD-10-CM

## 2014-02-23 DIAGNOSIS — J9600 Acute respiratory failure, unspecified whether with hypoxia or hypercapnia: Secondary | ICD-10-CM | POA: Diagnosis not present

## 2014-02-23 LAB — BASIC METABOLIC PANEL
BUN: 18 mg/dL (ref 6–23)
CO2: 25 mEq/L (ref 19–32)
Calcium: 9 mg/dL (ref 8.4–10.5)
Chloride: 99 mEq/L (ref 96–112)
Creatinine, Ser: 1.17 mg/dL (ref 0.50–1.35)
GFR calc Af Amer: 79 mL/min — ABNORMAL LOW (ref >90)
GFR, EST NON AFRICAN AMERICAN: 68 mL/min — AB (ref >90)
Glucose, Bld: 118 mg/dL — ABNORMAL HIGH (ref 70–99)
Potassium: 3.7 mEq/L (ref 3.7–5.3)
Sodium: 138 mEq/L (ref 137–147)

## 2014-02-23 NOTE — Plan of Care (Cosign Needed)
Problem: Undesirable Food Choices (NB-1.7) Goal: Nutrition education Formal process to instruct or train a patient/client in a skill or to impart knowledge to help patients/clients voluntarily manage or modify food choices and eating behavior to maintain or improve health. Outcome: Completed/Met Date Met:  02/23/14 Nutrition Education Note  Dietetic Intern consulted for nutrition education regarding low sodium/heart healthy diet education and CHF.  Dietetic Intern provided "Heart Healthy Nutrition Therapy" from the Academy of [employer]etics. Reviewed patient's dietary recall and pointed out sources of excessive sodium. Provided examples of ways to decrease sodium intake in diet. Discouraged intake of processed foods, salt shaker, and canned foods. Encouraged patient to choose lean meats and fresh fruits and vegetables.   Dietetic intern discussed why it is important for patient to adhere to diet recommendations, and emphasized the role of fuids, foods to avoid and importance of weighing self daily. Patient was receptive to diet education. Teach back method used.  Expect fair compliance.  Body mass index is 27.65 kg/(m^2). Patient meets criteria for overweight based on current BMI.  Current diet order is heart healthy, patient is consuming approximately 100% of meals at this time. Labs and medications reviewed. No further nutrition interventions warranted at this time. RD contact information provided. If additional nutrition issues arise, please re-consult RD.  Claudell Kyle, Dietetic Intern Pager: [phone]

## 2014-02-23 NOTE — Progress Notes (Signed)
The patient was in the room receiving education for his life vest when the nurse called out and reported that the patient was complaining of shoulder pain and became short of breath, the patient stated that his pain was 7 out of 10 pain.  Vitals are stable and the patient was given sublingual nitroglycerin x1 and 12 lead EKG was completed.  NP was paged via after hours operator; will continue to monitor patient.  Lorretta Harp RN

## 2014-02-23 NOTE — Progress Notes (Signed)
Paged cardiologist on call, no EKG changes per MD and orders given to continue to monitor patient. Lorretta Harp RN

## 2014-02-23 NOTE — Consult Note (Signed)
Heart Failure Navigator Consult Note  Presentation: Austin Allen 58 y.o. male with no significant past medical history who presented to [facility] on 02/19/2014 with complaints of chest pain. EMS was called from his work after he developed severe substernal chest pain. This began at about 11pm tonight per the patient's report. Hx is limited as the patient has apparently taking a muscle relaxant and has been morphine by EMS. He has also received ASA 324 mg and heparin 4000 units in the ER. He currently complains of 7/10 chest pain and is moaning with discomfort. There is associated shortness of breath and diaphoresis.  He denies problems such as HTN or diabetes. He does smoke 'sometimes.' denies drug use. Admits to alcohol 'sometimes.' No past hx of cardiac problems and no history of chest pain prior to tonight.  EKG from the field showed an acute anterolateral MI and a Code STEMI was activated by EMS   History reviewed. No pertinent past medical history.  History   Social History  . Marital Status: Single    Spouse Name: N/A    Number of Children: N/A  . Years of Education: N/A   Social History Main Topics  . Smoking status: Light Tobacco Smoker  . Smokeless tobacco: None  . Alcohol Use: None  . Drug Use: None  . Sexual Activity: None   Other Topics Concern  . None   Social History Narrative  . None    ECHO:Study Conclusions- 02/20/14  - Left ventricle: The cavity size was normal. Wall thickness was increased in a pattern of severe LVH. Systolic function was severely reduced. The estimated ejection fraction was in the range of 25% to 30%. There is akinesis of the basal-midanteroseptal and apical myocardium. There is hypokinesis of the inferior myocardium. Doppler parameters are consistent with abnormal left ventricular relaxation (grade 1 diastolic dysfunction). - Pericardium, extracardiac: A trivial pericardial effusion was identified.   BNP    Component Value  Date/Time   PROBNP 113.7 02/19/2014 0240    Education Assessment and Provision:  Detailed education and instructions provided on heart failure disease management including the following:  Signs and symptoms of Heart Failure When to call the physician Importance of daily weights Low sodium diet  Medication management Anticipated future follow-up appointments  Patient education given on each of the above topics.  Patient acknowledges understanding and acceptance of all instructions.  He will go home with a Lifevest.  He understands the need for this.  He acknowledges that he has some changes to make with his diet and lifestyle.  I will have dietician come speak to him as he is very interested in dietary changes.    Education Materials:  "Living Better With Heart Failure" Booklet, Daily Weight Tracker Tool and Heart Failure Educational Video.   High Risk Criteria for Readmission and/or Poor Patient Outcomes:   EF <30%- Yes- acute-25-30%  2 or more admissions in 6 months- No  Difficult social situation- No  Demonstrates medication noncompliance- No    Barriers of Care:  Knowledge of medical conditions  Discharge Planning:   Home alone (sister RN to stay with him for a while)

## 2014-02-23 NOTE — Progress Notes (Signed)
     SUBJECTIVE: no chest pain, SOB. Still weak  BP 103/83  Pulse 91  Temp(Src) 98.2 F (36.8 C) (Oral)  Resp 19  Ht 6\' 4"  (1.93 m)  Wt 227 lb 1.2 oz (103 kg)  BMI 27.65 kg/m2  SpO2 100%  Intake/Output Summary (Last 24 hours) at 02/23/14 0750 Last data filed at 02/23/14 3606  Gross per 24 hour  Intake    440 ml  Output    800 ml  Net   -360 ml    PHYSICAL EXAM General: Well developed, well nourished, in no acute distress. Alert and oriented x 3.  Psych:  Good affect, responds appropriately Neck: No JVD. No masses noted.  Lungs: Clear bilaterally with no wheezes or rhonci noted.  Heart: RRR with no murmurs noted. Abdomen: Bowel sounds are present. Soft, non-tender.  Extremities: No lower extremity edema.   LABS: Basic Metabolic Panel:  Recent Labs  77/03/40 0322 02/23/14 0641  NA 137 138  K 3.5* 3.7  CL 98 99  CO2 22 25  GLUCOSE 147* 118*  BUN 20 18  CREATININE 1.17 1.17  CALCIUM 8.8 9.0   CBC:  Recent Labs  02/21/14 0340  WBC 11.5*  HGB 17.9*  HCT 51.6  MCV 84.7  PLT 183   Current Meds: . aspirin EC  81 mg Oral Daily  . atorvastatin  80 mg Oral q1800  . carvedilol  3.125 mg Oral BID WC  . furosemide  20 mg Oral Daily  . heparin  5,000 Units Subcutaneous 3 times per day  . influenza vac split quadrivalent PF  0.5 mL Intramuscular Tomorrow-1000  . irbesartan  75 mg Oral Daily  . pneumococcal 23 valent vaccine  0.5 mL Intramuscular Tomorrow-1000  . sodium chloride  3 mL Intravenous Q12H  . spironolactone  12.5 mg Oral Daily  . Ticagrelor  90 mg Oral BID   Echo 02/20/14:  Left ventricle: The cavity size was normal. Wall thickness was increased in a pattern of severe LVH. Systolic function was severely reduced. The estimated ejection fraction was in the range of 25% to 30%. There is akinesis of the basal-midanteroseptal and apical myocardium. There is hypokinesis of the inferior myocardium. Doppler parameters are consistent with abnormal left  ventricular relaxation (grade 1 diastolic dysfunction). - Pericardium, extracardiac: A trivial pericardial effusion was identified.  ASSESSMENT AND PLAN: 58 yo male admitted with acute anterior STEMI 02/19/14  1. CAD/Acute anterior STEMI: LAD occluded by cath 02/19/14. DES placed in proximal LAD. Residual moderate disease in Circumflex and RCA.Marland Kitchen CHF following MI but diuresed well with rapid improvement in respiratory status. Relook cath 02/20/14 because of EKG changes showed patent LAD stent with sluggish flow in the very distal vessel. No intervention performed during relook cath. LVEF=25-30% by echo 02/20/14. Discussion regarding LifeVest but this has not been arranged. I spoke to Dennis Bast this am and she will begin working on this. LIfeVest will be used to lower the risk of sudden death in setting of large anterior MI, severe LV dysfunction. Continue ASA, Brilinta, statin, beta blocker, ARB. He will need a stress myoview in 4-6 weeks. Ambulate today. Probable d/c home in am 02/24/14  2. Ischemic cardiomyopathy: Medical management. Lifevest for prevention of SCD until echo is repeated to re-assess LVEF. If LVEF still reduced at time of echo, will need ICD.    3. Tobacco abuse: he has stopped smoking.   Rue Valladares  3/25/20157:50 AM

## 2014-02-23 NOTE — Progress Notes (Signed)
CARDIAC REHAB PHASE I   PRE:  Rate/Rhythm: 92 SR    BP: standing 88/60, sitting 96/70    SaO2:   MODE:  Ambulation: stood again    POST:  BP: standing 80/60, sitting 80/60, sitting for 8 min 90/60    Pt felt weak and dizzy upon standing this am. BP low. Asked to sit. Sat for a few minutes and then tried to stand again. Again pt became more weak and dizzy, could only stand a couple minutes. BP still low, see above. Pt doesn't feel he can walk this am. Sts he didn't rest well last night, battled bed all night. Pt requests to hear ed later. Will f/u. [phone]   Elissa Lovett [place] CES, ACSM 02/23/2014 9:07 AM

## 2014-02-24 LAB — BASIC METABOLIC PANEL
BUN: 17 mg/dL (ref 6–23)
CO2: 21 mEq/L (ref 19–32)
CREATININE: 1 mg/dL (ref 0.50–1.35)
Calcium: 9.1 mg/dL (ref 8.4–10.5)
Chloride: 101 mEq/L (ref 96–112)
GFR calc non Af Amer: 82 mL/min — ABNORMAL LOW (ref >90)
Glucose, Bld: 124 mg/dL — ABNORMAL HIGH (ref 70–99)
Potassium: 3.9 mEq/L (ref 3.7–5.3)
Sodium: 138 mEq/L (ref 137–147)

## 2014-02-24 NOTE — Progress Notes (Signed)
I cosign with Demarcus Steward on all assessments, medication administration and documentation for this shift. Huel Coventry, RN

## 2014-02-24 NOTE — Progress Notes (Signed)
UR completed Laiylah Roettger K. Ester Mabe, RN, BSN, MSHL, CCM  02/24/2014 5:34 PM

## 2014-02-24 NOTE — Progress Notes (Addendum)
CARDIAC REHAB PHASE I   PRE:  Rate/Rhythm: 83 SR  BP:  Supine:   Sitting: 80/60 manuel cuff 78/56 datascope  Standing: 88/63   SaO2:   MODE:  Ambulation: 460 ft   POST:  Rate/Rhythm: 89  BP:  Supine:   Sitting: 93/55  Standing:    SaO2:  1020-1145  Completed MI. Stent and CHF education with pt. He voices understanding. We discussed smoking cessation, states that he only smoked when he was having a beer and that he has already stopped. He has cessation resources if he feels that he needs it. Pt agrees to [facility] in [place], will send referral. Before walking checked pt's BP. With manuel cuff sitting 80/60 with machine 78/56. When pt stood he c/o of feeling lightheaded. BP standing 88/63. Pt said as he stood the dizziness got better. He was able to walk 460 feet. BP after walk 93/55. Pt to recliner after walk. Pt states the dizziness is not bad but he can feel it. Reported BP to RN.  Melina Copa RN 02/24/2014 11:43 AM

## 2014-02-24 NOTE — Progress Notes (Addendum)
     SUBJECTIVE: Pt feeling better today. No chest pain. Episode of shoulder pain while shaving yesterday. He has not ambulated in the hall.   BP 105/66  Pulse 91  Temp(Src) 99 F (37.2 C) (Oral)  Resp 18  Ht 6\' 4"  (1.93 m)  Wt 228 lb 1.6 oz (103.465 kg)  BMI 27.78 kg/m2  SpO2 100%  Intake/Output Summary (Last 24 hours) at 02/24/14 0737 Last data filed at 02/23/14 2055  Gross per 24 hour  Intake    483 ml  Output   1201 ml  Net   -718 ml    PHYSICAL EXAM General: Well developed, well nourished, in no acute distress. Alert and oriented x 3.  Psych:  Good affect, responds appropriately Neck: No JVD. No masses noted.  Lungs: Clear bilaterally with no wheezes or rhonci noted.  Heart: RRR with no murmurs noted. Abdomen: Bowel sounds are present. Soft, non-tender.  Extremities: No lower extremity edema.   LABS: Basic Metabolic Panel:  Recent Labs  41/58/30 0322 02/23/14 0641  NA 137 138  K 3.5* 3.7  CL 98 99  CO2 22 25  GLUCOSE 147* 118*  BUN 20 18  CREATININE 1.17 1.17  CALCIUM 8.8 9.0   Current Meds: . aspirin EC  81 mg Oral Daily  . atorvastatin  80 mg Oral q1800  . carvedilol  3.125 mg Oral BID WC  . furosemide  20 mg Oral Daily  . heparin  5,000 Units Subcutaneous 3 times per day  . influenza vac split quadrivalent PF  0.5 mL Intramuscular Tomorrow-1000  . irbesartan  75 mg Oral Daily  . pneumococcal 23 valent vaccine  0.5 mL Intramuscular Tomorrow-1000  . sodium chloride  3 mL Intravenous Q12H  . spironolactone  12.5 mg Oral Daily  . Ticagrelor  90 mg Oral BID     ASSESSMENT AND PLAN: 58 yo male admitted with acute anterior STEMI 02/19/14   1. CAD/Acute anterior STEMI: LAD occluded by cath 02/19/14. DES placed in proximal LAD. Residual moderate disease in Circumflex and RCA.Marland Kitchen CHF following MI but diuresed well with rapid improvement in respiratory status. Relook cath 02/20/14 because of EKG changes showed patent LAD stent with sluggish flow in the very  distal vessel. No intervention performed during relook cath. LVEF=25-30% by echo 02/20/14. Discussion regarding LifeVest but this has not been arranged. I spoke to Dennis Bast this am and she will begin working on this. LIfeVest will be used to lower the risk of sudden death in setting of large anterior MI, severe LV dysfunction. Continue ASA, Brilinta, statin, beta blocker, ARB. He will need a stress myoview in 4-6 weeks.   Plan today to ambulate during morning, work with cardiac rehab. If he is stable this afternoon, then possible d/c later today  2. Ischemic cardiomyopathy: Medical management. Lifevest for prevention of SCD until echo is repeated to re-assess LVEF. If LVEF still reduced at time of echo, will need ICD.   3. Tobacco abuse: he has stopped smoking.    Alaney Witter  3/26/20157:37 AM  Addendum: 12:02pm: Pt with dizziness when standing. Hypotension. Will stop ARB. Follow closely today. Will not go home today.   Brevon Dewald 12:03 PM 02/24/2014

## 2014-02-25 ENCOUNTER — Other Ambulatory Visit: Payer: Self-pay | Admitting: Physician Assistant

## 2014-02-25 ENCOUNTER — Encounter (HOSPITAL_COMMUNITY): Payer: Self-pay | Admitting: Physician Assistant

## 2014-02-25 ENCOUNTER — Telehealth: Payer: Self-pay | Admitting: Cardiovascular Disease

## 2014-02-25 DIAGNOSIS — I213 ST elevation (STEMI) myocardial infarction of unspecified site: Secondary | ICD-10-CM

## 2014-02-25 DIAGNOSIS — I2510 Atherosclerotic heart disease of native coronary artery without angina pectoris: Secondary | ICD-10-CM | POA: Diagnosis present

## 2014-02-25 LAB — BASIC METABOLIC PANEL
BUN: 15 mg/dL (ref 6–23)
CALCIUM: 8.9 mg/dL (ref 8.4–10.5)
CHLORIDE: 101 meq/L (ref 96–112)
CO2: 23 meq/L (ref 19–32)
CREATININE: 1.02 mg/dL (ref 0.50–1.35)
GFR calc Af Amer: >90 mL/min (ref >90)
GFR calc non Af Amer: 80 mL/min — ABNORMAL LOW (ref >90)
GLUCOSE: 102 mg/dL — AB (ref 70–99)
Potassium: 3.8 mEq/L (ref 3.7–5.3)
Sodium: 138 mEq/L (ref 137–147)

## 2014-02-25 MED ORDER — CARVEDILOL 3.125 MG PO TABS: 3.1250 mg | ORAL_TABLET | Freq: Two times a day (BID) | ORAL | Status: DC

## 2014-02-25 MED ORDER — NITROGLYCERIN 0.4 MG SL SUBL: 0.4000 mg | SUBLINGUAL_TABLET | SUBLINGUAL | Status: DC | PRN

## 2014-02-25 MED ORDER — FUROSEMIDE 20 MG PO TABS: 20.0000 mg | ORAL_TABLET | Freq: Every day | ORAL | Status: DC

## 2014-02-25 MED ORDER — PNEUMOCOCCAL VAC POLYVALENT 25 MCG/0.5ML IJ INJ
0.5000 mL | INJECTION | Freq: Once | INTRAMUSCULAR | Status: DC
  Filled 2014-02-25: qty 0.5

## 2014-02-25 MED ORDER — TICAGRELOR 90 MG PO TABS: 90.0000 mg | ORAL_TABLET | Freq: Two times a day (BID) | ORAL | Status: DC

## 2014-02-25 MED ORDER — IRBESARTAN 75 MG PO TABS: 75.0000 mg | ORAL_TABLET | Freq: Every day | ORAL | Status: DC

## 2014-02-25 MED ORDER — ATORVASTATIN CALCIUM 80 MG PO TABS: 80.0000 mg | ORAL_TABLET | Freq: Every day | ORAL | Status: DC

## 2014-02-25 MED ORDER — ASPIRIN 81 MG PO TBEC: 81.0000 mg | DELAYED_RELEASE_TABLET | Freq: Every day | ORAL | Status: AC

## 2014-02-25 MED ORDER — INFLUENZA VAC SPLIT QUAD 0.5 ML IM SUSP
0.5000 mL | Freq: Once | INTRAMUSCULAR | Status: DC
Start: 2014-02-25 — End: 2014-02-25
  Filled 2014-02-25: qty 0.5

## 2014-02-25 MED ORDER — SPIRONOLACTONE 25 MG PO TABS: 12.5000 mg | ORAL_TABLET | Freq: Every day | ORAL | Status: DC

## 2014-02-25 NOTE — Progress Notes (Signed)
Patient discharged to home.  Vital signs stable.  IV removed prior to discharge, IV site clean, dry, and intact.  Discharge instructions, education, and medications discussed with patient prior to discharge.  Lifevest representative present at bedside this morning to discuss LifeVest education.  Patient voiced understanding of discharge information.

## 2014-02-25 NOTE — Telephone Encounter (Signed)
Have called number & extension x 2 continues to be unavailable Mylo Red RN

## 2014-02-25 NOTE — Telephone Encounter (Signed)
New message   athem [facility]bs calling would like to speak with nurse concerning patient.  Has appt to see Dr. Excell Seltzer on  4/7.     Fax [phone] within 10 business day.

## 2014-02-25 NOTE — Discharge Instructions (Signed)

## 2014-02-25 NOTE — Discharge Summary (Signed)
See full note.cdm 

## 2014-02-25 NOTE — Progress Notes (Signed)
     SUBJECTIVE: No chest pain or SOB.   BP 102/66  Pulse 80  Temp(Src) 98 F (36.7 C) (Oral)  Resp 18  Ht 6\' 4"  (1.93 m)  Wt 227 lb 4.8 oz (103.103 kg)  BMI 27.68 kg/m2  SpO2 100%  Intake/Output Summary (Last 24 hours) at 02/25/14 0749 Last data filed at 02/24/14 2340  Gross per 24 hour  Intake   1326 ml  Output   1850 ml  Net   -524 ml    PHYSICAL EXAM General: Well developed, well nourished, in no acute distress. Alert and oriented x 3.  Psych:  Good affect, responds appropriately Neck: No JVD. No masses noted.  Lungs: Clear bilaterally with no wheezes or rhonci noted.  Heart: RRR with no murmurs noted. Abdomen: Bowel sounds are present. Soft, non-tender.  Extremities: No lower extremity edema.   LABS: Basic Metabolic Panel:  Recent Labs  93/71/69 0700 02/25/14 0518  NA 138 138  K 3.9 3.8  CL 101 101  CO2 21 23  GLUCOSE 124* 102*  BUN 17 15  CREATININE 1.00 1.02  CALCIUM 9.1 8.9   Current Meds: . aspirin EC  81 mg Oral Daily  . atorvastatin  80 mg Oral q1800  . carvedilol  3.125 mg Oral BID WC  . furosemide  20 mg Oral Daily  . heparin  5,000 Units Subcutaneous 3 times per day  . influenza vac split quadrivalent PF  0.5 mL Intramuscular Tomorrow-1000  . irbesartan  75 mg Oral Daily  . pneumococcal 23 valent vaccine  0.5 mL Intramuscular Tomorrow-1000  . sodium chloride  3 mL Intravenous Q12H  . spironolactone  12.5 mg Oral Daily  . Ticagrelor  90 mg Oral BID     ASSESSMENT AND PLAN: 58 yo male admitted with acute anterior STEMI 02/19/14   1. CAD/Acute anterior STEMI: LAD occluded by cath 02/19/14. DES placed in proximal LAD. Residual moderate disease in Circumflex and RCA.Marland Kitchen CHF following MI but diuresed well with rapid improvement in respiratory status. Relook cath 02/20/14 because of EKG changes showed patent LAD stent with sluggish flow in the very distal vessel. No intervention performed during relook cath. LVEF=25-30% by echo 02/20/14. Discussion  regarding LifeVest but this has not been arranged. I spoke to Dennis Bast this am and she will begin working on this. LIfeVest will be used to lower the risk of sudden death in setting of large anterior MI, severe LV dysfunction. Continue ASA, Brilinta, statin, beta blocker, ARB. BP better and tolerating all meds. He will need a stress myoview in 4-6 weeks.   2. Ischemic cardiomyopathy: Medical management. Lifevest for prevention of SCD until echo is repeated to re-assess LVEF. He has been fitted and his Lifevest is in the room. If LVEF still reduced at time of echo, will need ICD.   3. Tobacco abuse: he has stopped smoking.   4. Dispo: D/C home today. Work excuse for one month. Follow up with Dr. Excell Seltzer or an office PA/NP in 2-3 weeks.       Austin Allen  3/27/20157:49 AM

## 2014-02-25 NOTE — Progress Notes (Signed)
CARDIAC REHAB PHASE I   PRE:  Rate/Rhythm: 97   BP:  Supine:   Sitting:   Standing: 108/70   SaO2: 97 RA  MODE:  Ambulation: 760 ft   POST:  Rate/Rhythm: 104  BP:  Supine:   Sitting: 104/60  Standing:    SaO2: 99 RA 0950-1030 On arrival pt attempting to put on life vest, states "I need help." Pt anxious and not confident about trying to work vest. I assisted him to get it on. I reviewed booklet with pt some.He denies any dizziness today walking. BP much improved today. I encouraged pt call the lifevest number if he has any questions. Pt does seem overwhelmed with all of his situation.  Melina Copa RN 02/25/2014 10:30 AM

## 2014-02-25 NOTE — Discharge Summary (Signed)
Discharge Summary   Patient ID: Austin Allen MRN: [id number], DOB/AGE: 1956/04/30 58 y.o. Admit date: 02/18/2014 D/C date:     02/25/2014  Primary Cardiologist: Dr. 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 Peak troponin >20    HPI: Austin Allen is a 58 y.o. male with no past medical history, most likely due to lack of medical care, who was admitted to [facility] on 02/19/14 with an acute anterior STEMI.    Hospital Course:  CAD/Acute anterior STEMI: EKG in ED revealed NSR with acute anterolateral STEMI pattern. A code STEMI was called and he was taken for emergent LHC. Cardiac catheterization revealed total occlusion of the proximal LAD which was treated successfully with primary PCI with a drug-eluting stent as well as severe proximal RCA stenosis (codominant vessel), nonobstructive LCx stenosis and reserved LV systolic function with severely elevated LVEDP. The patient was admitted for observation following cath. He began to complain of SOB and CXR revealed diffuse pulmonary edema. He was placed on BiPAP and aggressively diuresed with IV lasix. An ACEi and spironolactone were added and BB was discontinued given his respiratory distress. On 3/22 cardiology was called to see the patient due to critical EKG. He was in NSR but with significant and worsened ST elevation most profound in V4. He denied CP but ECG was felt to be very concerning for stent thrombosis. It was elected to take him back for re-look cath to ensure stent patency.  Re-look cath on 3/21 revealed continued patency of the proximal LAD stent, moderate-severe mid-LCx stenosis (dominant vessel), moderate-severe RCA stenosis (nondominant vessel) and severe segmental LV dysfunction. It was felt that the persistent ST elevation was related to poor myocardial tissue perfusion after STEMI. The prox LAD stent was widely patent but flow into the distal LAD was slow. It was recommended to treat the residual CAD medically but with close follow up with a stress perfusion study in 4-6 weeks depending on his recovery. A 2D echo was ordered for assessment of LV function to assess indication for life vest. This revealed severe LVH; EF 25-30%, akinesis of the basal-midanteroseptal and apical myocardium and hypokinesis of the inferior myocardium as well as G1DD and a trivial pericardial effusion. It was elected to proceed with a LIfeVest to lower the risk of sudden death in setting of large anterior MI, severe LV dysfunction.   Ischemic cardiomyopathy: EF 25-30%. Medical management with ARB and BB. An ARB was chosen because he had a cough w/ ACE. He has been fitted for Lifevest for prevention of SCD until echo is repeated to re-assess LVEF. If LVEF still reduced at time of echo he will need an ICD.   Acute congestive heart failure: CHF following MI but diuresed well with rapid improvement in respiratory status. -6.2 L, weight stable at 227 lbs.   Tobacco abuse: he has stopped smoking.   Hyperglycemia- Hg A1c 6.1. He will need to establish care and follow up with a PCP. Patient verbalized understanding.  HLD- continue statin.   The patient has  had a complicated hospital course with a prolonged recovery. He had some trouble with weakness, dizziness and hypotension with medication titration. It took some time for him to become stable, but he is now he is tolerating all of his medications and doing well. The radial catheter site is healing well. He has been seen by Dr. Sanjuana Kava today and  deemed stable for discharge home. All follow-up appointments have been scheduled, including a follow up exercise nuclear stress test in one month. Smoking cessation was disscussed in length. A 30 day free supply of Brilinta was provided for the patient. Discharge medications include ASA/Brilinta, atorvastatin 80mg , carvedilol 3.12mg  BID, irbesartan 75mg  daily, spironolactone 12.5mg  daily, Lasix 20mg  daily. BP better and tolerating all meds. He was provided a written work excuse note for one month.  Outstanding studies: He will need a follow up ECHO in 3 months to assess need for ICD.  Discharge Vitals: Blood pressure 102/66, pulse 80, temperature 98 F (36.7 C), temperature source Oral, resp. rate 18, height 6\' 4"  (1.93 m), weight 227 lb 4.8 oz (103.103 kg), SpO2 100.00%.  Labs: Lab Results  Component Value Date   WBC 11.5* 02/21/2014   HGB 17.9* 02/21/2014   HCT 51.6 02/21/2014   MCV 84.7 02/21/2014   PLT 183 02/21/2014    Recent Labs Lab 02/21/14 0340  02/25/14 0518  NA 137  < > 138  K 3.7  < > 3.8  CL 99  < > 101  CO2 24  < > 23  BUN 14  < > 15  CREATININE 1.04  < > 1.02  CALCIUM 9.0  < > 8.9  PROT 7.2  --   --   BILITOT 0.9  --   --   ALKPHOS 76  --   --   ALT 66*  --   --   AST 99*  --   --   GLUCOSE 105*  < > 102*  < > = values in this interval not displayed.  Lab Results  Component Value Date   CHOL 164 02/19/2014   HDL 40 02/19/2014   LDLCALC 114* 02/19/2014   TRIG 49 02/19/2014     Diagnostic Studies/Procedures   Cardiac Catheterization Procedure Note 02/19/14 Name: Rayhan Abrahams  MRN: [id number]  DOB: 1956/08/12  Procedure: Left Heart Cath, Selective Coronary Angiography, LV angiography, PTCA and stenting of the proximal LAD  Indication: Acute anterolateral STEMI  Procedural Details: The right wrist was prepped, draped, and anesthetized with 1% lidocaine. Using the modified Seldinger technique, a 5/6 French sheath was introduced into the right radial artery. 3 mg of  verapamil was administered through the sheath, weight-based unfractionated heparin was administered intravenously. Standard Judkins catheters were used for selective coronary angiography. Left ventriculography was performed at the completion of the procedure. Catheter exchanges were performed over an exchange length guidewire.  PROCEDURAL FINDINGS  Hemodynamics:  AO 86/58  LV 90/39  Coronary angiography:  Coronary dominance: right  Left mainstem: arises from left cusp  Left anterior descending (LAD): total occlusion in the proximal vessel. After reperfusion, the vessel is large and wraps around the LV apex. There is TIMI-0 flow.  Left circumflex (LCx): large, dominant vessel. There is 50% mid-vessel stenosis. The OM and left PDA branches are patent.  Right coronary artery (RCA): codominant vessel, moderate in caliber. There is an 80% proximal stenosis. Otherwise the vessel is patent. The RV marginal branch and acute marginal and PDA are patent.  Left ventriculography: Left ventricular systolic function is normal,  LVEF is estimated at 55%, there is mitral regurgitation related to catheter position (also during diastole).  PCI Note: Following the diagnostic procedure, the decision was made to proceed with PCI. The patient was loaded with brilinta 180 mg on the table. Weight-based bivalirudin was given for anticoagulation. Once a therapeutic ACT was achieved, a 6 [place] SB-LAD 3.5 cm guide catheter was inserted. A cougar coronary guidewire was used to cross the lesion. The lesion was predilated with a 2.5 mm balloon. The lesion was then stented with a 3.25 x 18 mm Xience drug-eluting stent. The stent was postdilated with a 3.75 mm noncompliant balloon. There was initially slow flow especially at the apical portion of the LAD. However, after a few minutes the flow returned to TIMI-3. Following PCI, there was 0% residual stenosis. Final angiography confirmed an excellent result. The patient tolerated the  procedure well. There were no immediate procedural complications. A TR band was used for radial hemostasis. The patient was transferred to the post catheterization recovery area for further monitoring.  PCI Data:  Vessel - LAD/Segment - proximal  Percent Stenosis (pre) 100  TIMI-flow 0  Stent 3.25x18 mm Xience DES  Percent Stenosis (post) 0  TIMI-flow (post) 3  Final Conclusions:  1. Total occlusion of the proximal LAD, treated successfully with primary PCI (drug-eluting stent)  2. Severe proximal RCA stenosis (codominant vessel)  3. Nonobstructive LCx stenosis  4. Preserved LV systolic function with severely elevated LVEDP  Recommendations:  Tx to CCU. ASA/brilinta at least 12 months. aggrastat 6 hours. No beta blocker or ACE yet as BP is marginal. No fluids with elevated LVEDP.   Cardiac Catheterization Procedure Note 02/20/14 Name: Orlanda Lemmerman  MRN: [id number]  DOB: 09-Sep-1956  Procedure: Left Heart Cath, Selective Coronary Angiography, LV angiography  Indication: Recurrent ST elevation after STEMI and primary PCI 3/21.  Procedural Details: The right wrist was prepped, draped, and anesthetized with 1% lidocaine. Using the modified Seldinger technique, a 5 French sheath was introduced into the right radial artery. 3 mg of verapamil was administered through the sheath, weight-based unfractionated heparin was administered intravenously. Standard Judkins catheters were used for selective coronary angiography and left ventriculography. Catheter exchanges were performed over an exchange length guidewire. There were no immediate procedural complications. A TR band was used for radial hemostasis at the completion of the procedure. The patient was transferred to the post catheterization recovery area for further monitoring.  Procedural Findings:  Hemodynamics:  AO 100/77  LV 103/8  Coronary angiography:  Coronary dominance: right  Left mainstem: Widely patent  Left anterior descending (LAD):  Patent proximal LAD stent without stenosis. Large wraparound LAD. Large first diagonal. There is slow flow into the apical portion of the LAD. No significant stenosis throughout.  Left circumflex (LCx): Large, dominant vessel. Widely patent in the proximal vessel. First OM is patent. Just after the first OM there is 70-75% stenosis in the mid-circumflex. The 2nd OM and left PDA branches are patent.  Right coronary artery (RCA): nondominant vessel, 75% proximal stenosis.  Left ventriculography: Left ventricular systolic function is moderately to severely depressed. There is severe hypokinesis from the mid-anterior wall through the peri-apical region. The estimated LVEF is 35%.  Final Conclusions:  1. Continued patency of the proximal LAD stent  2. Moderate-severe mid-LCx stenosis (dominant vessel)  3. Moderate-severe RCA stenosis (nondominant vessel)  4. Severe segmental LV dysfunction  Recommendations: suspect ST elevation related to poor myocardial tissue perfusion after STEMI. The prox LAD stent is widely patent  but flow into the distal LAD is slow. Would treat residual CAD medically but should have stress perfusion study in about 4-6 weeks depending on his recovery. Await 2D echo for assessment of LV function - seems borderline for consideration of LifeVest by ventriculography.  Dg Chest Port 1 View  02/20/2014   CLINICAL DATA:  CHF  EXAM: PORTABLE CHEST - 1 VIEW  COMPARISON:  Portable exam 0719 hr compared to 02/19/2014  FINDINGS: Upper normal heart size.  Mediastinal contours and pulmonary vascularity normal.  Improved pulmonary infiltrates, with greatest residual infiltrate located in right upper lobe.  No pleural effusion or pneumothorax.  Bones unremarkable.  IMPRESSION: Improved pulmonary infiltrates.     Dg Chest Port 1 View  02/19/2014   CLINICAL DATA:  SOB  EXAM: PORTABLE CHEST - 1 VIEW  COMPARISON:  None.  FINDINGS: The cardiac silhouette is enlarged. Diffuse bilateral pulmonary opacities  are appreciated. There is prominence of the interstitial markings, peribronchial cuffing, and vascular indistinctness. The osseous structures are unremarkable. No focal regions of consolidation are appreciated.  IMPRESSION: Diffuse interstitial infiltrate. Differential considerations pulmonary edema, versus an infectious or inflammatory interstitial infiltrate. Surveillance evaluation recommended.     2D ECHO: 02/20/2014 LV EF: 25% - 30% ------------------------------------------------------------ Indications: MI - acute 410.91. ------------------------------------------------------------ History: Risk factors: Hypertension. Dyslipidemia. ------------------------------------------------------------ Study Conclusions - Left ventricle: The cavity size was normal. Wall thickness was increased in a pattern of severe LVH. Systolic function was severely reduced. The estimated ejection fraction was in the range of 25% to 30%. There is akinesis of the basal-midanteroseptal and apical myocardium. There is hypokinesis of the inferior myocardium. Doppler parameters are consistent with abnormal left ventricular relaxation (grade 1 diastolic dysfunction). - Pericardium, extracardiac: A trivial pericardial effusion was identified.    Discharge Medications     Medication List    STOP taking these medications       ibuprofen 200 MG tablet  Commonly known as:  ADVIL,MOTRIN      TAKE these medications       aspirin 81 MG EC tablet  Take 1 tablet (81 mg total) by mouth daily.     atorvastatin 80 MG tablet  Commonly known as:  LIPITOR  Take 1 tablet (80 mg total) by mouth daily at 6 PM.     carvedilol 3.125 MG tablet  Commonly known as:  COREG  Take 1 tablet (3.125 mg total) by mouth 2 (two) times daily with a meal.     cyclobenzaprine 10 MG tablet  Commonly known as:  FLEXERIL  Take 10 mg by mouth 3 (three) times daily as needed for muscle spasms.     econazole nitrate 1 % cream    Apply 1 application topically daily.     HYDROcodone-acetaminophen 5-325 MG per tablet  Commonly known as:  NORCO/VICODIN  Take 1 tablet by mouth every 6 (six) hours as needed for moderate pain.     irbesartan 75 MG tablet  Commonly known as:  AVAPRO  Take 1 tablet (75 mg total) by mouth daily.     nitroGLYCERIN 0.4 MG SL tablet  Commonly known as:  NITROSTAT  Place 1 tablet (0.4 mg total) under the tongue every 5 (five) minutes x 3 doses as needed for chest pain.     spironolactone 25 MG tablet  Commonly known as:  ALDACTONE  Take 0.5 tablets (12.5 mg total) by mouth daily.     Ticagrelor 90 MG Tabs tablet  Commonly known as:  BRILINTA  Take 1 tablet (  90 mg total) by mouth 2 (two) times daily.        Disposition   The patient will be discharged in stable condition to home. Discharge Orders   Future Appointments Provider Department Dept Phone   03/08/2014 2:45 PM [name], MD [facility] [place] Office [phone]   03/23/2014 11:30 AM [facility] 2 (Nuc Treadm) [facility] [address] [phone]   Future Orders Complete By Expires   Amb Referral to Cardiac Rehabilitation  As directed      Follow-up Information   Follow up with [name], MD On 03/08/2014. (@ 2:045)    Specialty:  Cardiology   Contact information:   [address] [place] [address] [phone]       Follow up with [facility] On 03/23/2014. (Exercise stress test at 11:30am. Please do not eat before hand.)    Contact information:   [address] [place] [address] [phone]        Duration of Discharge Encounter: Greater than 30 minutes including physician and PA time.  SignedVenetia Maxon, Mechel Schutter PA-C 02/25/2014, 10:30 AM

## 2014-03-08 ENCOUNTER — Encounter: Payer: Self-pay | Admitting: Cardiovascular Disease

## 2014-03-08 ENCOUNTER — Ambulatory Visit (INDEPENDENT_AMBULATORY_CARE_PROVIDER_SITE_OTHER): Payer: BC Managed Care – PPO | Admitting: Cardiovascular Disease

## 2014-03-08 VITALS — BP 103/65 | HR 78 | Ht 76.0 in | Wt 234.0 lb

## 2014-03-08 DIAGNOSIS — I255 Ischemic cardiomyopathy: Secondary | ICD-10-CM

## 2014-03-08 DIAGNOSIS — I2589 Other forms of chronic ischemic heart disease: Secondary | ICD-10-CM

## 2014-03-08 DIAGNOSIS — I2510 Atherosclerotic heart disease of native coronary artery without angina pectoris: Secondary | ICD-10-CM

## 2014-03-08 MED ORDER — CARVEDILOL 6.25 MG PO TABS: 6.2500 mg | ORAL_TABLET | Freq: Two times a day (BID) | ORAL | Status: DC

## 2014-03-08 NOTE — Progress Notes (Signed)
HPI:   58 year old gentleman presenting for hospital followup evaluation. The patient was admitted March 20 through 02/25/2014 with an anterolateral STEMI. He underwent primary PCI with a drug-eluting stent in the proximal LAD. He was noted to have residual 80% proximal RCA stenosis (nondominant) with medical therapy recommended. He did undergo relook heart catheterization because of marked residual ST segment elevation and this demonstrated continued stent patency. There is severe LV dysfunction noted with a left ventricular ejection fraction of 25-30% and associated akinesis of the anteroseptal and apical myocardium. The patient was discharged home with a life vest. His post MI medical therapy includes aspirin, brilinta, Spirinolactone, Irbesartan, atorvastatin, and carvedilol.  The patient complains of generalized fatigue. He denies chest pain or dyspnea. He is doing some walking without symptoms. He denies edema, orthopnea, or PND. He has had some problems with transmitted his life vest data.  He has had an extreme amount of financial stress. He previously worked for [employer] and is not going to be able to continue with work. He is navigating through unemployment and disability paperwork. He is concerned about loss of his health insurance.  Outpatient Encounter Prescriptions as of 03/08/2014  Medication Sig  . aspirin EC 81 MG EC tablet Take 1 tablet (81 mg total) by mouth daily.  Marland Kitchen atorvastatin (LIPITOR) 80 MG tablet Take 1 tablet (80 mg total) by mouth daily at 6 PM.  . carvedilol (COREG) 3.125 MG tablet Take 1 tablet (3.125 mg total) by mouth 2 (two) times daily with a meal.  . cyclobenzaprine (FLEXERIL) 10 MG tablet Take 10 mg by mouth 3 (three) times daily as needed for muscle spasms.  Marland Kitchen econazole nitrate 1 % cream Apply 1 application topically daily.  . furosemide (LASIX) 20 MG tablet Take 1 tablet (20 mg total) by mouth daily.  Marland Kitchen HYDROcodone-acetaminophen (NORCO/VICODIN) 5-325 MG per  tablet Take 1 tablet by mouth every 6 (six) hours as needed for moderate pain.  Marland Kitchen irbesartan (AVAPRO) 75 MG tablet Take 1 tablet (75 mg total) by mouth daily.  . nitroGLYCERIN (NITROSTAT) 0.4 MG SL tablet Place 1 tablet (0.4 mg total) under the tongue every 5 (five) minutes x 3 doses as needed for chest pain.  Marland Kitchen spironolactone (ALDACTONE) 25 MG tablet Take 0.5 tablets (12.5 mg total) by mouth daily.  . Ticagrelor (BRILINTA) 90 MG TABS tablet Take 1 tablet (90 mg total) by mouth 2 (two) times daily.    No Known Allergies  Past Medical History  Diagnosis Date  . CAD (coronary artery disease)     a. 02/19/14 STEMI s/p PCI with DES to LAD   b.  02/19/14 s/p relook LHC with patent LAD stent and treated medically.    Marland Kitchen HLD (hyperlipidemia)   . Ischemic cardiomyopathy     a. ECHO 02/20/14 with EF 25-30% and put on life vest   . HTN (hypertension)   . Tobacco abuse   . Overweight     ROS: Negative except as per HPI  BP 103/65  Pulse 78  Ht 6\' 4"  (1.93 m)  Wt 106.142 kg (234 lb)  BMI 28.50 kg/m2  PHYSICAL EXAM: Pt is alert and oriented, NAD HEENT: normal Neck: JVP - normal, carotids 2+= without bruits Lungs: CTA bilaterally CV: RRR without murmur or gallop Abd: soft, NT, Positive BS, no hepatomegaly Ext: no C/C/E, distal pulses intact and equal Skin: warm/dry no rash  EKG:  Normal sinus rhythm 78 beats per minute, anterolateral infarct age recent.  ASSESSMENT AND PLAN:  1. Anterolateral STEMI. His MI was complicated by severe LV dysfunction. He should continue with a life vest for a total of 3 months and at that time we will reassess LV function with an echocardiogram. Will make determination of need for ICD evaluation. He should continue on his current medical program which includes dual antiplatelet therapy with aspirin and brilinta. We will try to provide him with some samples of his prescription medications. I would like him to return for followup in 4 weeks with one of our APP's.    2. Severe cardiomyopathy. Will increase carvedilol to 6.25 mg twice daily. Continue aldactone and irbesartan. Repeat metabolic panel in 4 weeks when he returns for followup evaluation.  3. Hyperlipidemia. Continue high-dose atorvastatin.  From a disability perspective, I would anticipate the patient would qualify for medical disability considering his myocardial infarction with severe LV dysfunction. Will be able to better assess need for long-term disability pending followup echocardiogram in 3 months.  Tonny BollmanMichael Malinda Mayden 03/08/2014 3:15 PM

## 2014-03-08 NOTE — Patient Instructions (Addendum)
Your physician has requested that you have an echocardiogram after 05/28/14. Echocardiography is a painless test that uses sound waves to create images of your heart. It provides your doctor with information about the size and shape of your heart and how well your heart's chambers and valves are working. This procedure takes approximately one hour. There are no restrictions for this procedure.  Your physician recommends that you schedule a follow-up appointment in: 4 WEEKS with PA/NP  Your physician has recommended you make the following change in your medication: INCREASE Carvedilol to 6.25mg  take one by mouth twice a day  Please call the office when you are running out of your current medications.  We will make changes in your medications and switch to samples.

## 2014-03-09 ENCOUNTER — Telehealth: Payer: Self-pay | Admitting: Cardiovascular Disease

## 2014-03-09 ENCOUNTER — Encounter: Payer: Self-pay | Admitting: Cardiovascular Disease

## 2014-03-09 NOTE — Telephone Encounter (Signed)
New problem   Pt need letter stating he will be out of work. Please call pt.

## 2014-03-09 NOTE — Telephone Encounter (Signed)
Returned call to patient he stated he needed note saying he needs to be out of work for appox 3 months.Message sent to Dr.Cooper's nurse.

## 2014-03-10 NOTE — Telephone Encounter (Signed)
Letter completed per the pt's request and mailed to his home.

## 2014-03-10 NOTE — Telephone Encounter (Signed)
Yes he will need to be out of work for minimum of 3 months

## 2014-03-10 NOTE — Telephone Encounter (Signed)
Follow Up    Pt is calling follow up on call from yesterday regarding a note to be excused from work. Please call.

## 2014-03-18 ENCOUNTER — Telehealth: Payer: Self-pay | Admitting: Cardiovascular Disease

## 2014-03-18 NOTE — Telephone Encounter (Signed)
Letter Mailed to Pt Home per His Request

## 2014-03-21 ENCOUNTER — Telehealth: Payer: Self-pay

## 2014-03-21 NOTE — Telephone Encounter (Signed)
Will ask Julieta Gutting to look over his meds and sample what we can. If no samples available can switch to generic medications. For example, brilinta could be changed to clopidogrel, Avapro to Losartan or lisinopril.  thx

## 2014-03-22 ENCOUNTER — Telehealth: Payer: Self-pay

## 2014-03-22 NOTE — Telephone Encounter (Signed)
Austin Allen spoke with the pt and advised him to come into the office today to speak with me about his medications.

## 2014-03-22 NOTE — Telephone Encounter (Signed)
Called patient to come by office to pick up samples and to speak to Leotis Shames

## 2014-03-23 ENCOUNTER — Encounter (HOSPITAL_COMMUNITY): Payer: BC Managed Care – PPO

## 2014-03-25 MED ORDER — SPIRONOLACTONE 25 MG PO TABS: 12.5000 mg | ORAL_TABLET | Freq: Every day | ORAL | Status: DC

## 2014-03-25 MED ORDER — ROSUVASTATIN CALCIUM 40 MG PO TABS: 40.0000 mg | ORAL_TABLET | Freq: Every day | ORAL | Status: DC

## 2014-03-25 MED ORDER — FUROSEMIDE 20 MG PO TABS: 20.0000 mg | ORAL_TABLET | Freq: Every day | ORAL | Status: DC

## 2014-03-25 MED ORDER — OLMESARTAN MEDOXOMIL 20 MG PO TABS: 20.0000 mg | ORAL_TABLET | Freq: Every day | ORAL | Status: AC

## 2014-03-25 NOTE — Telephone Encounter (Signed)
The pt came into the office today to discuss changing his medications due to cost.  Per Dr Excell Seltzer the pt will stop Lipitor and start Crestor 40mg  daily (4 WEEK supply of samples provided).  The pt will stop Avapro and start Benicar 20mg  daily (5 WEEK supply of samples provided). The pt will need to have a BMP checked at his upcoming appointment with Tereso Newcomer PA-C. The pt states that he already has Brilinta and ASA samples at home. The pt requested that his other cardiac medications be refilled at this time (Spironolactone and Furosemide).

## 2014-03-28 ENCOUNTER — Encounter: Payer: Self-pay | Admitting: Cardiovascular Disease

## 2014-03-28 NOTE — Telephone Encounter (Signed)
New message          Pt has questions about his medication.

## 2014-03-28 NOTE — Telephone Encounter (Signed)
This encounter was created in error - please disregard.

## 2014-03-30 ENCOUNTER — Telehealth: Payer: Self-pay | Admitting: Cardiovascular Disease

## 2014-03-30 NOTE — Telephone Encounter (Signed)
New message     Talk to Lauren before his next appointment

## 2014-03-30 NOTE — Telephone Encounter (Signed)
The pt went to pick up his prescriptions and found out his insurance had been cancelled.  The pt's Medicaid is pending at this time.  I told the pt I would mail him the paperwork to complete for the [facility]. I also provided the pt with the phone number for billing.

## 2014-03-31 ENCOUNTER — Encounter: Payer: Self-pay | Admitting: *Deleted

## 2014-04-05 ENCOUNTER — Encounter: Payer: Self-pay | Admitting: Physician Assistant

## 2014-04-05 ENCOUNTER — Ambulatory Visit (INDEPENDENT_AMBULATORY_CARE_PROVIDER_SITE_OTHER): Payer: BC Managed Care – PPO | Admitting: Physician Assistant

## 2014-04-05 ENCOUNTER — Telehealth: Payer: Self-pay | Admitting: *Deleted

## 2014-04-05 VITALS — BP 100/70 | HR 77 | Ht 76.0 in | Wt 243.0 lb

## 2014-04-05 DIAGNOSIS — I2510 Atherosclerotic heart disease of native coronary artery without angina pectoris: Secondary | ICD-10-CM

## 2014-04-05 DIAGNOSIS — E785 Hyperlipidemia, unspecified: Secondary | ICD-10-CM

## 2014-04-05 DIAGNOSIS — I252 Old myocardial infarction: Secondary | ICD-10-CM | POA: Insufficient documentation

## 2014-04-05 DIAGNOSIS — I2589 Other forms of chronic ischemic heart disease: Secondary | ICD-10-CM

## 2014-04-05 DIAGNOSIS — I255 Ischemic cardiomyopathy: Secondary | ICD-10-CM

## 2014-04-05 MED ORDER — FUROSEMIDE 20 MG PO TABS: 20.0000 mg | ORAL_TABLET | Freq: Every day | ORAL | Status: DC

## 2014-04-05 MED ORDER — CARVEDILOL 6.25 MG PO TABS: 6.2500 mg | ORAL_TABLET | Freq: Two times a day (BID) | ORAL | Status: DC

## 2014-04-05 MED ORDER — SPIRONOLACTONE 25 MG PO TABS: 12.5000 mg | ORAL_TABLET | Freq: Every day | ORAL | Status: AC

## 2014-04-05 NOTE — Patient Instructions (Addendum)
LAB WORK TODAY; BMET  Your physician recommends that you return for lab work in:  6-8 weeks for Fasting Lipids and LFTs (414.01, 272.4).    YOU WILL NEED TO FOLLOW UP WITH DR. Excell Seltzer 06/14/14 @ 4 PM

## 2014-04-05 NOTE — Progress Notes (Signed)
[address] [place], [place]  [address] Phone: [phone] Fax:  [phone]  Date:  04/05/2014   ID:  Austin Allen Ausmus, DOB Nov 04, 1956, MRN [id number]  PCP:  Default, Provider, MD  Cardiologist:  Dr. Tonny BollmanMichael Cooper      History of Present Illness: Austin Allen [place] is a 58 y.o. male with a hx of CAD s/p AL STEMI in 01/2014 c/b acute systolic CHF and pulmonary edema.  He underwent PCI with DES to the proximal LAD.  He had residual prox RCA 80% (co-dominant) that was treated medically.  Re-look LHC due to persistent STE demonstrated patent stent in the LAD.  He had evidence of ischemic cardiomyopathy with an EF of 25-30%.  He was d/c on LifeVest.    He was seen in follow up by Dr. Excell Seltzerooper 03/08/14. Medications were adjusted and he is brought back today for follow up.  He is having significant financial difficulty.  He has lost his health insurance.  We are providing him with samples.  The patient denies chest pain, shortness of breath, syncope, orthopnea, PND or significant pedal edema.   Studies:  - LHC (02/19/14):  prox LAD occluded, mid CFX 505, prox RCA 80%, EF 55%.  PCI:  3.25x18 mm Xience DES to prox LAD.   Re-look LHC (02/20/14):  Patent stent, mid CFX 70-75%, prox RCA 75%, EF 35%.    - Echo (02/20/14):  Severe LVH, EF 25-30%, anteroseptal and apical AK, Gr 1 DD, trivial eff    Recent Labs: 02/19/2014: HDL Cholesterol 40; LDL (calc) 114*; Pro B Natriuretic peptide (BNP) 113.7; TSH 0.523  02/21/2014: ALT 66*; Hemoglobin 17.9*  02/25/2014: Creatinine 1.02; Potassium 3.8   Wt Readings from Last 3 Encounters:  04/05/14 243 lb (110.224 kg)  03/08/14 234 lb (106.142 kg)  02/25/14 227 lb 4.8 oz (103.103 kg)     Past Medical History  Diagnosis Date  . CAD (coronary artery disease)     a. 02/19/14 STEMI s/p PCI with DES to LAD   b.  02/19/14 s/p relook LHC with patent LAD stent and treated medically.    Marland Kitchen. HLD (hyperlipidemia)   . Ischemic cardiomyopathy     a. ECHO 02/20/14 with EF 25-30%  and put on life vest   . HTN (hypertension)   . Tobacco abuse   . Overweight     Current Outpatient Prescriptions  Medication Sig Dispense Refill  . aspirin EC 81 MG EC tablet Take 1 tablet (81 mg total) by mouth daily.      . carvedilol (COREG) 6.25 MG tablet Take 1 tablet (6.25 mg total) by mouth 2 (two) times daily with a meal.  60 tablet  6  . econazole nitrate 1 % cream Apply 1 application topically daily.      . furosemide (LASIX) 20 MG tablet Take 1 tablet (20 mg total) by mouth daily.  30 tablet  6  . nitroGLYCERIN (NITROSTAT) 0.4 MG SL tablet Place 1 tablet (0.4 mg total) under the tongue every 5 (five) minutes x 3 doses as needed for chest pain.  25 tablet  11  . olmesartan (BENICAR) 20 MG tablet Take 1 tablet (20 mg total) by mouth daily.      . rosuvastatin (CRESTOR) 40 MG tablet Take 1 tablet (40 mg total) by mouth daily.      Marland Kitchen. spironolactone (ALDACTONE) 25 MG tablet Take 0.5 tablets (12.5 mg total) by mouth daily.  30 tablet  6  . Ticagrelor (BRILINTA) 90 MG TABS  tablet Take 1 tablet (90 mg total) by mouth 2 (two) times daily.  60 tablet  11   No current facility-administered medications for this visit.    Allergies:   Review of patient's allergies indicates no known allergies.   Social History:  The patient  reports that he has quit smoking. He started smoking about 6 weeks ago. He does not have any smokeless tobacco history on file.   Family History:  The patient's family history is not on file.   ROS:  Please see the history of present illness.      All other systems reviewed and negative.   PHYSICAL EXAM: VS:  BP 100/70  Pulse 77  Ht 6\' 4"  (1.93 m)  Wt 243 lb (110.224 kg)  BMI 29.59 kg/m2 Well nourished, well developed, in no acute distress HEENT: normal Neck: no JVD Cardiac:  normal S1, S2; RRR; no murmur Lungs:  clear to auscultation bilaterally, no wheezing, rhonchi or rales Abd: soft, nontender, no hepatomegaly Ext: no edema Skin: warm and dry Neuro:   CNs 2-12 intact, no focal abnormalities noted  EKG:  NSR, HR 77, LAD, Anterolat Q waves with Anterolat TWI   (evolving anterior MI)  ASSESSMENT AND PLAN:  1. History of acute anterior wall MI:  He is doing well since he underwent emergent PCI with DES to the prox LAD in 01/2014 for acute anterior STEMI.  He understands the importance of dual antiplatelet Rx.   2. CAD (coronary artery disease):  No angina.  Continue ASA, Brilinta, beta blocker, statin. 3. Cardiomyopathy, ischemic:  Continue current dose of beta blocker, ARB, spironolactone.  BP will not likely tolerate further adjustments in Rx.  Check BMET today.  Continue LifeVest.  Echo due in 05/2014.  If EF remains < 35%, refer to EP for ICD.   4. Hyperlipidemia:  Continue high dose Crestor.  We will continue to provide him with samples.  Check Check Lipids and LFTs in 6 weeks.   5. Disposition:  F/u with Dr. [name] after his echo in 05/2014.  Signed, Tereso Newcomer, PA-C  04/05/2014 4:13 PM

## 2014-04-05 NOTE — Telephone Encounter (Signed)
Called [employer] drug rep to get benicar samples for this patient, once he finds out who supplies our benicar he will call us. I have supplied patient with patient assistance program through [employer] for his Benicar  Patient assistance program: [place]-----------------  for [facility] AND [facility] [facility] OUTREACH------------ for coreg and aldactone  Will mail these to patient.

## 2014-04-06 LAB — BASIC METABOLIC PANEL
BUN: 19 mg/dL (ref 6–23)
CALCIUM: 9.3 mg/dL (ref 8.4–10.5)
CO2: 27 meq/L (ref 19–32)
Chloride: 105 mEq/L (ref 96–112)
Creatinine, Ser: 1.2 mg/dL (ref 0.4–1.5)
GFR: 78.86 mL/min (ref >60.00)
GLUCOSE: 94 mg/dL (ref 70–99)
Potassium: 4 mEq/L (ref 3.5–5.1)
Sodium: 138 mEq/L (ref 135–145)

## 2014-04-07 ENCOUNTER — Telehealth: Payer: Self-pay | Admitting: *Deleted

## 2014-04-07 NOTE — Telephone Encounter (Signed)
Called patient after getting Benicar samples in office.

## 2014-04-07 NOTE — Telephone Encounter (Signed)
pt notified about lab results with verbal understanding  

## 2014-04-19 ENCOUNTER — Telehealth: Payer: Self-pay | Admitting: Cardiovascular Disease

## 2014-04-19 NOTE — Telephone Encounter (Signed)
Samples of Benicar (14 days) and Brilinta (16 days) placed at the front desk for pick-up.  Pt aware.

## 2014-04-19 NOTE — Telephone Encounter (Signed)
New message           Pt has a question about his medication and request that lauren give him a call

## 2014-04-20 NOTE — Telephone Encounter (Signed)
PLACED SAMPLES OF BENICAR IN HIS BAG UP FRONT

## 2014-04-27 ENCOUNTER — Telehealth: Payer: Self-pay | Admitting: *Deleted

## 2014-04-27 NOTE — Telephone Encounter (Signed)
Completed application forms for patient assistance on his medications Medication list:  BRILINTA CRESTOR BENICAR CARVEDILOL ALDACTONE

## 2014-05-11 ENCOUNTER — Telehealth: Payer: Self-pay

## 2014-05-11 NOTE — Telephone Encounter (Signed)
Patient called for samples of benicar, crestor, brilinta, placed sample

## 2014-05-12 NOTE — Progress Notes (Signed)
Patient ID: Austin Allen, male   DOB: 06-10-1956, 58 y.o.   MRN: [id number] benicar was placed up front on 04/07/14 and return on 05/12/14

## 2014-05-30 ENCOUNTER — Other Ambulatory Visit (HOSPITAL_COMMUNITY): Payer: BC Managed Care – PPO

## 2014-05-30 ENCOUNTER — Ambulatory Visit (HOSPITAL_COMMUNITY): Payer: Medicaid Other | Attending: Cardiovascular Disease | Admitting: Radiology

## 2014-05-30 ENCOUNTER — Other Ambulatory Visit (INDEPENDENT_AMBULATORY_CARE_PROVIDER_SITE_OTHER): Payer: Medicaid Other

## 2014-05-30 DIAGNOSIS — E785 Hyperlipidemia, unspecified: Secondary | ICD-10-CM | POA: Diagnosis not present

## 2014-05-30 DIAGNOSIS — I25119 Atherosclerotic heart disease of native coronary artery with unspecified angina pectoris: Secondary | ICD-10-CM

## 2014-05-30 DIAGNOSIS — I517 Cardiomegaly: Secondary | ICD-10-CM | POA: Diagnosis not present

## 2014-05-30 DIAGNOSIS — F17200 Nicotine dependence, unspecified, uncomplicated: Secondary | ICD-10-CM | POA: Insufficient documentation

## 2014-05-30 DIAGNOSIS — I059 Rheumatic mitral valve disease, unspecified: Secondary | ICD-10-CM | POA: Diagnosis not present

## 2014-05-30 DIAGNOSIS — I2589 Other forms of chronic ischemic heart disease: Secondary | ICD-10-CM | POA: Insufficient documentation

## 2014-05-30 DIAGNOSIS — I2510 Atherosclerotic heart disease of native coronary artery without angina pectoris: Secondary | ICD-10-CM | POA: Insufficient documentation

## 2014-05-30 DIAGNOSIS — E669 Obesity, unspecified: Secondary | ICD-10-CM | POA: Insufficient documentation

## 2014-05-30 DIAGNOSIS — I2109 ST elevation (STEMI) myocardial infarction involving other coronary artery of anterior wall: Secondary | ICD-10-CM

## 2014-05-30 DIAGNOSIS — I079 Rheumatic tricuspid valve disease, unspecified: Secondary | ICD-10-CM | POA: Insufficient documentation

## 2014-05-30 DIAGNOSIS — I5031 Acute diastolic (congestive) heart failure: Secondary | ICD-10-CM

## 2014-05-30 DIAGNOSIS — I255 Ischemic cardiomyopathy: Secondary | ICD-10-CM

## 2014-05-30 DIAGNOSIS — I509 Heart failure, unspecified: Secondary | ICD-10-CM | POA: Diagnosis not present

## 2014-05-30 DIAGNOSIS — I252 Old myocardial infarction: Secondary | ICD-10-CM | POA: Diagnosis not present

## 2014-05-30 LAB — LIPID PANEL
CHOL/HDL RATIO: 2
Cholesterol: 77 mg/dL (ref 0–200)
HDL: 31.5 mg/dL — ABNORMAL LOW (ref >39.00)
LDL Cholesterol: 32 mg/dL (ref 0–99)
NONHDL: 45.5
Triglycerides: 67 mg/dL (ref 0.0–149.0)
VLDL: 13.4 mg/dL (ref 0.0–40.0)

## 2014-05-30 LAB — HEPATIC FUNCTION PANEL
ALK PHOS: 69 U/L (ref 39–117)
ALT: 48 U/L (ref 0–53)
AST: 28 U/L (ref 0–37)
Albumin: 4.3 g/dL (ref 3.5–5.2)
BILIRUBIN DIRECT: 0.1 mg/dL (ref 0.0–0.3)
Total Bilirubin: 0.5 mg/dL (ref 0.2–1.2)
Total Protein: 7.7 g/dL (ref 6.0–8.3)

## 2014-05-30 NOTE — Progress Notes (Signed)
Echocardiogram performed.  

## 2014-05-31 ENCOUNTER — Telehealth: Payer: Self-pay | Admitting: *Deleted

## 2014-05-31 ENCOUNTER — Encounter: Payer: Self-pay | Admitting: *Deleted

## 2014-05-31 NOTE — Telephone Encounter (Signed)
neither phone # for pt or his sister Aram Beecham are working. I will send out a results letter today, lab work good

## 2014-06-07 ENCOUNTER — Telehealth: Payer: Self-pay | Admitting: Cardiovascular Disease

## 2014-06-07 NOTE — Telephone Encounter (Signed)
Pt calling to locate where disability forms are within the system.  Informed pt that Dr Excell Seltzer and nurse are out of the office today.  Informed pt that I will transfer him to medical records for tracking of disability paperwork.  Pt also wanted to let Dr Excell Seltzer and nurse know that his left shoulder, scapula is hurting, especially on rotation.  Pt denies any cp, sob, or cardiac complaints.  Pt states it feels muscular, but he doesn't want to predict wrong, due to his cardiac history.  Told pt that if he starts to have any cp, sob, or becomes acutely distressed, then he should call 911 and notify our office.  Advised pt of his upcoming appt with Dr Excell Seltzer next Tuesday.  Told pt that I will send this message to Dr Excell Seltzer and nurse for further review.

## 2014-06-07 NOTE — Telephone Encounter (Signed)
New message     Patient wants to give information on his disability paperwork .

## 2014-06-14 ENCOUNTER — Encounter: Payer: Self-pay | Admitting: Cardiovascular Disease

## 2014-06-14 ENCOUNTER — Ambulatory Visit (INDEPENDENT_AMBULATORY_CARE_PROVIDER_SITE_OTHER): Payer: Medicaid Other | Admitting: Cardiovascular Disease

## 2014-06-14 VITALS — BP 115/62 | HR 88 | Ht 76.0 in | Wt 255.1 lb

## 2014-06-14 DIAGNOSIS — I5022 Chronic systolic (congestive) heart failure: Secondary | ICD-10-CM

## 2014-06-14 MED ORDER — SIMVASTATIN 20 MG PO TABS: 20.0000 mg | ORAL_TABLET | Freq: Every day | ORAL | Status: AC

## 2014-06-14 NOTE — Patient Instructions (Signed)
Your physician has recommended you make the following change in your medication: STOP Crestor, START Simvastatin 20mg  take one by mouth every evening  You have been referred to Dr Ladona Ridgel for ICD evaluation.

## 2014-06-14 NOTE — Progress Notes (Signed)
HPI:  Austin Allen is a 58 y.o. male with a hx of CAD s/p AL STEMI in 01/2014 c/b acute systolic CHF and pulmonary edema. He underwent PCI with DES to the proximal LAD. He had residual prox RCA 80% (co-dominant) that was treated medically. Re-look LHC due to persistent STE demonstrated patent stent in the LAD. He had evidence of ischemic cardiomyopathy with an EF of 25-30%. He was d/c on LifeVest. The patient had a followup echocardiogram June 29 demonstrating persistent severe LV dysfunction with an ejection fraction estimated between 20 and 25%.  From a symptomatic perspective, he is doing fairly well. He complains of mild dyspnea with exertion. No chest pain or pressure. No edema, orthopnea, or PND. He describes New York Heart Association functional class II symptoms.    Outpatient Encounter Prescriptions as of 06/14/2014  Medication Sig  . aspirin EC 81 MG EC tablet Take 1 tablet (81 mg total) by mouth daily.  . carvedilol (COREG) 6.25 MG tablet Take 1 tablet (6.25 mg total) by mouth 2 (two) times daily with a meal.  . furosemide (LASIX) 20 MG tablet Take 1 tablet (20 mg total) by mouth daily.  Marland Kitchen. olmesartan (BENICAR) 20 MG tablet Take 1 tablet (20 mg total) by mouth daily.  . rosuvastatin (CRESTOR) 40 MG tablet Take 1 tablet (40 mg total) by mouth daily.  Marland Kitchen. spironolactone (ALDACTONE) 25 MG tablet Take 0.5 tablets (12.5 mg total) by mouth daily.  . Ticagrelor (BRILINTA) 90 MG TABS tablet Take 1 tablet (90 mg total) by mouth 2 (two) times daily.  . [DISCONTINUED] econazole nitrate 1 % cream Apply 1 application topically daily.  . [DISCONTINUED] nitroGLYCERIN (NITROSTAT) 0.4 MG SL tablet Place 1 tablet (0.4 mg total) under the tongue every 5 (five) minutes x 3 doses as needed for chest pain.    No Known Allergies  Past Medical History  Diagnosis Date  . CAD (coronary artery disease)     a. 02/19/14 STEMI s/p PCI with DES to LAD   b.  02/19/14 s/p relook LHC with patent LAD stent and treated  medically.    Marland Kitchen. HLD (hyperlipidemia)   . Ischemic cardiomyopathy     a. ECHO 02/20/14 with EF 25-30% and put on life vest   . HTN (hypertension)   . Tobacco abuse   . Overweight(278.02)     ROS: Negative except as per HPI  BP 115/62  Pulse 88  Ht 6\' 4"  (1.93 m)  Wt 255 lb 1.9 oz (115.722 kg)  BMI 31.07 kg/m2  PHYSICAL EXAM: Pt is alert and oriented, NAD HEENT: normal Neck: JVP - normal, carotids 2+= without bruits Lungs: CTA bilaterally CV: RRR without murmur or gallop Abd: soft, NT, Positive BS, no hepatomegaly Ext: no C/C/E, distal pulses intact and equal Skin: warm/dry no rash  2D Echo: Study Conclusions  - Left ventricle: The cavity size was normal. Wall thickness was normal. Systolic function was severely reduced. The estimated ejection fraction was in the range of 20% to 25%. Akinesis of the mid-apicalanteroseptal myocardium. - Mitral valve: There was mild regurgitation. - Left atrium: The atrium was mildly dilated. - Right atrium: The atrium was mildly dilated. - Pulmonary arteries: Systolic pressure was mildly increased. PA peak pressure: 38 mm Hg (S).  ASSESSMENT AND PLAN: 1. Coronary artery disease, native vessel with old MI. The patient is stable on aspirin and brilinta. He should complete 12 months of dual antiplatelet therapy which will take him through March 2016.  2. Severe ischemic  cardiomyopathy. He has been LVEF less than 30% with New York Heart Association functional class II symptoms, now greater than 3 months out from his infarct. I think he should be evaluated for an ICD. He has been compliant with his LifeVest. He will be referred to EP. We discussed the rationale for an ICD with respect to primary prevention of sudden cardiac death. The patient understands. I also called the patient's sister who is a nurse at his request.  3. Hyperlipidemia. The patient cannot afford Crestor and we don't have samples today. Will switch him to simvastatin 20 mg. His  lipids are very low with a total cholesterol of only 77.  [name] 06/14/2014 4:31 PM

## 2014-06-15 ENCOUNTER — Telehealth: Payer: Self-pay | Admitting: Cardiovascular Disease

## 2014-06-15 NOTE — Telephone Encounter (Signed)
New message    Patient calling was seen in the office on yesterday wants to know the next step .

## 2014-06-15 NOTE — Telephone Encounter (Signed)
Pt is calling to schedule appt with Dr Ladona Ridgel, transferred to EP scheduler to schedule appt.

## 2014-06-27 ENCOUNTER — Encounter: Payer: Self-pay | Admitting: Internal Medicine

## 2014-06-27 ENCOUNTER — Ambulatory Visit (INDEPENDENT_AMBULATORY_CARE_PROVIDER_SITE_OTHER): Payer: Medicaid Other | Admitting: Internal Medicine

## 2014-06-27 VITALS — BP 123/85 | HR 65 | Ht 76.0 in | Wt 258.0 lb

## 2014-06-27 DIAGNOSIS — I2589 Other forms of chronic ischemic heart disease: Secondary | ICD-10-CM

## 2014-06-27 DIAGNOSIS — I255 Ischemic cardiomyopathy: Secondary | ICD-10-CM

## 2014-06-27 LAB — CBC WITH DIFFERENTIAL/PLATELET
BASOS PCT: 0.5 % (ref 0.0–3.0)
Basophils Absolute: 0 10*3/uL (ref 0.0–0.1)
Eosinophils Absolute: 0.4 10*3/uL (ref 0.0–0.7)
Eosinophils Relative: 5.9 % — ABNORMAL HIGH (ref 0.0–5.0)
HCT: 46.3 % (ref 39.0–52.0)
Hemoglobin: 15.1 g/dL (ref 13.0–17.0)
Lymphocytes Relative: 37.2 % (ref 12.0–46.0)
Lymphs Abs: 2.3 10*3/uL (ref 0.7–4.0)
MCHC: 32.5 g/dL (ref 30.0–36.0)
MCV: 83.1 fl (ref 78.0–100.0)
MONO ABS: 0.6 10*3/uL (ref 0.1–1.0)
Monocytes Relative: 10.1 % (ref 3.0–12.0)
NEUTROS PCT: 46.3 % (ref 43.0–77.0)
Neutro Abs: 2.8 10*3/uL (ref 1.4–7.7)
PLATELETS: 191 10*3/uL (ref 150.0–400.0)
RBC: 5.58 Mil/uL (ref 4.22–5.81)
RDW: 14.6 % (ref 11.5–15.5)
WBC: 6.1 10*3/uL (ref 4.0–10.5)

## 2014-06-27 LAB — BASIC METABOLIC PANEL
BUN: 19 mg/dL (ref 6–23)
CHLORIDE: 105 meq/L (ref 96–112)
CO2: 28 meq/L (ref 19–32)
CREATININE: 1.2 mg/dL (ref 0.4–1.5)
Calcium: 9.3 mg/dL (ref 8.4–10.5)
GFR: 78.06 mL/min (ref >60.00)
Glucose, Bld: 106 mg/dL — ABNORMAL HIGH (ref 70–99)
Potassium: 4.4 mEq/L (ref 3.5–5.1)
Sodium: 138 mEq/L (ref 135–145)

## 2014-06-27 NOTE — Patient Instructions (Signed)
Your physician has recommended that you have a defibrillator inserted. An implantable cardioverter defibrillator (ICD) is a small device that is placed in your chest or, in rare cases, your abdomen. This device uses electrical pulses or shocks to help control life-threatening, irregular heartbeats that could lead the heart to suddenly stop beating (sudden cardiac arrest). Leads are attached to the ICD that goes into your heart. This is done in the hospital and usually requires an overnight stay. Please see the instruction sheet given to you today for more information.   See instruction sheet for procedure 

## 2014-06-27 NOTE — Progress Notes (Signed)
Referring Physician:  Dr Gillian Shieldsooper  Austin Allen is a 58 y.o. male with a h/o CAD, ischemic CM and NYHA Class III CHF who presents today for EP consultation regarding risks of sudden death.  He reports being in good health until 02/19/14 when he presented with acute MI.  He underwent PCI at that time.  He had a lifevest placed.  He has been treated with an optimal medical regimen but his EF remains depressed.  He reports SOB with moderate activity.  He can presently walk about a half mile at a time.  Today, he denies symptoms of palpitations, chest pain, orthopnea, PND, lower extremity edema, dizziness, presyncope, syncope, or neurologic sequela. The patient is tolerating medications without difficulties and is otherwise without complaint today.   Past Medical History  Diagnosis Date  . CAD (coronary artery disease)     a. 02/19/14 STEMI s/p PCI with DES to LAD   b.  02/19/14 s/p relook LHC with patent LAD stent and treated medically.    Marland Kitchen. HLD (hyperlipidemia)   . Ischemic cardiomyopathy     a. ECHO 02/20/14 with EF 25-30% and put on life vest   . HTN (hypertension)   . Tobacco abuse   . Overweight(278.02)    No past surgical history on file.  Current Outpatient Prescriptions  Medication Sig Dispense Refill  . aspirin EC 81 MG EC tablet Take 1 tablet (81 mg total) by mouth daily.      . carvedilol (COREG) 6.25 MG tablet Take 1 tablet (6.25 mg total) by mouth 2 (two) times daily with a meal.  60 tablet  11  . furosemide (LASIX) 20 MG tablet Take 1 tablet (20 mg total) by mouth daily.  30 tablet  11  . olmesartan (BENICAR) 20 MG tablet Take 1 tablet (20 mg total) by mouth daily.      . simvastatin (ZOCOR) 20 MG tablet Take 1 tablet (20 mg total) by mouth at bedtime.  30 tablet  6  . spironolactone (ALDACTONE) 25 MG tablet Take 0.5 tablets (12.5 mg total) by mouth daily.  30 tablet  11  . Ticagrelor (BRILINTA) 90 MG TABS tablet Take 1 tablet (90 mg total) by mouth 2 (two) times daily.  60 tablet   11   No current facility-administered medications for this visit.    No Known Allergies  History   Social History  . Marital Status: Single    Spouse Name: N/A    Number of Children: N/A  . Years of Education: N/A   Occupational History  . Not on file.   Social History Main Topics  . Smoking status: Former Smoker    Start date: 02/18/2014  . Smokeless tobacco: Not on file  . Alcohol Use: Yes     Comment: quit since his MI  . Drug Use: Yes     Comment: occasional marijuana  . Sexual Activity: Not on file   Other Topics Concern  . Not on file   Social History Narrative   [place] alone [place]   Previously worked for [employer] but has been unemployed since his recent MI.  He is working towards disability at this time    Family History  Problem Relation Age of Onset  . Coronary artery disease      ROS- All systems are reviewed and negative except as per the HPI above  Physical Exam: Filed Vitals:   06/27/14 0902  BP: 123/85  Pulse: 65  Height: 6\' 4"  (1.93  m)  Weight: 258 lb (117.028 kg)    GEN- The patient is well appearing, alert and oriented x 3 today.   He is wearing a lifevest today. Head- normocephalic, atraumatic Eyes-  Sclera clear, conjunctiva pink Ears- hearing intact Oropharynx- clear Neck- supple, no JVP Lymph- no cervical lymphadenopathy Lungs- Clear to ausculation bilaterally, normal work of breathing Heart- Regular rate and rhythm, no murmurs, rubs or gallops, PMI not laterally displaced GI- soft, NT, ND, + BS Extremities- no clubbing, cyanosis, or edema MS- no significant deformity or atrophy Skin- no rash or lesion Psych- euthymic mood, full affect Neuro- strength and sensation are intact  EKG today reveals sinus rhythm 65 bpm, PR 180, QRS 98. Qtc 432, anteroseptal infarct, LAD Echo 05/30/14- EF 20%, mild MR  Assessment and Plan:  1. CAD/ischemic CM/ chronic systolic dysfunction The patient has an ischemic CM (EF 20%), NYHA Class  III CHF, and CAD. At this time, he meets MADIT II/ SCD-HeFT criteria for ICD implantation for primary prevention of sudden death.  Risks, benefits, alternatives to ICD implantation were discussed in detail with the patient today. The patient  understands that the risks include but are not limited to bleeding, infection, pneumothorax, perforation, tamponade, vascular damage, renal failure, MI, stroke, death, inappropriate shocks, and lead dislodgement and wishes to proceed. His QRS < 130 msec and therefore he does not meet criteria for CRT.   We will therefore schedule ICD implantation at the next available time.

## 2014-06-30 DIAGNOSIS — Z6831 Body mass index (BMI) 31.0-31.9, adult: Secondary | ICD-10-CM | POA: Diagnosis not present

## 2014-06-30 DIAGNOSIS — Z7982 Long term (current) use of aspirin: Secondary | ICD-10-CM | POA: Diagnosis not present

## 2014-06-30 DIAGNOSIS — Z79899 Other long term (current) drug therapy: Secondary | ICD-10-CM | POA: Diagnosis not present

## 2014-06-30 DIAGNOSIS — E663 Overweight: Secondary | ICD-10-CM | POA: Diagnosis not present

## 2014-06-30 DIAGNOSIS — I5022 Chronic systolic (congestive) heart failure: Secondary | ICD-10-CM | POA: Diagnosis not present

## 2014-06-30 DIAGNOSIS — Z87891 Personal history of nicotine dependence: Secondary | ICD-10-CM | POA: Diagnosis not present

## 2014-06-30 DIAGNOSIS — I2510 Atherosclerotic heart disease of native coronary artery without angina pectoris: Secondary | ICD-10-CM | POA: Diagnosis not present

## 2014-06-30 DIAGNOSIS — E785 Hyperlipidemia, unspecified: Secondary | ICD-10-CM | POA: Diagnosis not present

## 2014-06-30 DIAGNOSIS — I10 Essential (primary) hypertension: Secondary | ICD-10-CM | POA: Diagnosis not present

## 2014-06-30 DIAGNOSIS — Z7902 Long term (current) use of antithrombotics/antiplatelets: Secondary | ICD-10-CM | POA: Diagnosis not present

## 2014-06-30 DIAGNOSIS — I2589 Other forms of chronic ischemic heart disease: Secondary | ICD-10-CM | POA: Diagnosis not present

## 2014-06-30 DIAGNOSIS — I252 Old myocardial infarction: Secondary | ICD-10-CM | POA: Diagnosis not present

## 2014-06-30 DIAGNOSIS — I509 Heart failure, unspecified: Secondary | ICD-10-CM | POA: Diagnosis present

## 2014-06-30 DIAGNOSIS — Z9861 Coronary angioplasty status: Secondary | ICD-10-CM | POA: Diagnosis not present

## 2014-06-30 MED ORDER — SODIUM CHLORIDE 0.9 % IR SOLN
80.0000 mg | Status: DC
  Filled 2014-06-30: qty 2

## 2014-06-30 MED ORDER — CHLORHEXIDINE GLUCONATE 4 % EX LIQD
60.0000 mL | Freq: Once | CUTANEOUS | Status: DC
  Filled 2014-06-30: qty 60

## 2014-06-30 MED ORDER — SODIUM CHLORIDE 0.9 % IV SOLN
INTRAVENOUS | Status: DC
  Administered 2014-07-01: 12:00:00 via INTRAVENOUS

## 2014-06-30 MED ORDER — CEFAZOLIN SODIUM-DEXTROSE 2-3 GM-% IV SOLR: 2.0000 g | INTRAVENOUS | Status: DC

## 2014-07-01 ENCOUNTER — Encounter (HOSPITAL_COMMUNITY)
Admission: RE | Disposition: A | Discharge status: Home / Self Care | Payer: Self-pay | Source: Ambulatory Visit | Attending: Internal Medicine

## 2014-07-01 ENCOUNTER — Ambulatory Visit (HOSPITAL_COMMUNITY)
Admission: RE | Admit: 2014-07-01 | Discharge: 2014-07-02 | Disposition: A | Discharge status: Home / Self Care | Payer: Medicaid Other | Source: Ambulatory Visit | Attending: Internal Medicine | Admitting: Internal Medicine

## 2014-07-01 ENCOUNTER — Encounter (HOSPITAL_COMMUNITY): Payer: Self-pay | Admitting: Emergency Medicine

## 2014-07-01 DIAGNOSIS — I5022 Chronic systolic (congestive) heart failure: Secondary | ICD-10-CM | POA: Insufficient documentation

## 2014-07-01 DIAGNOSIS — Z7902 Long term (current) use of antithrombotics/antiplatelets: Secondary | ICD-10-CM | POA: Insufficient documentation

## 2014-07-01 DIAGNOSIS — I255 Ischemic cardiomyopathy: Secondary | ICD-10-CM | POA: Insufficient documentation

## 2014-07-01 DIAGNOSIS — Z7982 Long term (current) use of aspirin: Secondary | ICD-10-CM | POA: Insufficient documentation

## 2014-07-01 DIAGNOSIS — Z79899 Other long term (current) drug therapy: Secondary | ICD-10-CM | POA: Insufficient documentation

## 2014-07-01 DIAGNOSIS — I2510 Atherosclerotic heart disease of native coronary artery without angina pectoris: Secondary | ICD-10-CM | POA: Insufficient documentation

## 2014-07-01 DIAGNOSIS — I2589 Other forms of chronic ischemic heart disease: Secondary | ICD-10-CM

## 2014-07-01 DIAGNOSIS — I10 Essential (primary) hypertension: Secondary | ICD-10-CM | POA: Insufficient documentation

## 2014-07-01 DIAGNOSIS — I509 Heart failure, unspecified: Secondary | ICD-10-CM | POA: Insufficient documentation

## 2014-07-01 DIAGNOSIS — Z87891 Personal history of nicotine dependence: Secondary | ICD-10-CM | POA: Insufficient documentation

## 2014-07-01 DIAGNOSIS — I252 Old myocardial infarction: Secondary | ICD-10-CM | POA: Insufficient documentation

## 2014-07-01 DIAGNOSIS — Z6831 Body mass index (BMI) 31.0-31.9, adult: Secondary | ICD-10-CM | POA: Insufficient documentation

## 2014-07-01 DIAGNOSIS — E785 Hyperlipidemia, unspecified: Secondary | ICD-10-CM | POA: Diagnosis present

## 2014-07-01 DIAGNOSIS — Z9861 Coronary angioplasty status: Secondary | ICD-10-CM | POA: Insufficient documentation

## 2014-07-01 DIAGNOSIS — E663 Overweight: Secondary | ICD-10-CM | POA: Insufficient documentation

## 2014-07-01 HISTORY — PX: IMPLANTABLE CARDIOVERTER DEFIBRILLATOR IMPLANT: SHX5473

## 2014-07-01 HISTORY — DX: Chronic systolic (congestive) heart failure: I50.22

## 2014-07-01 HISTORY — PX: IMPLANTABLE CARDIOVERTER DEFIBRILLATOR IMPLANT: SHX5860

## 2014-07-01 LAB — SURGICAL PCR SCREEN
MRSA, PCR: NEGATIVE
STAPHYLOCOCCUS AUREUS: NEGATIVE

## 2014-07-01 SURGERY — IMPLANTABLE CARDIOVERTER DEFIBRILLATOR IMPLANT
Anesthesia: LOCAL

## 2014-07-01 MED ORDER — CEFAZOLIN SODIUM 1-5 GM-% IV SOLN
1.0000 g | Freq: Four times a day (QID) | INTRAVENOUS | Status: AC
  Administered 2014-07-01 – 2014-07-02 (×3): 1 g via INTRAVENOUS
  Filled 2014-07-01 (×3): qty 50

## 2014-07-01 MED ORDER — HEPARIN (PORCINE) IN NACL 2-0.9 UNIT/ML-% IJ SOLN
INTRAMUSCULAR | Status: AC
Start: 2014-07-01 — End: 2014-07-01
  Filled 2014-07-01: qty 500

## 2014-07-01 MED ORDER — ONDANSETRON HCL 4 MG/2ML IJ SOLN
INTRAMUSCULAR | Status: AC
  Filled 2014-07-01: qty 2

## 2014-07-01 MED ORDER — LIDOCAINE HCL (PF) 1 % IJ SOLN
INTRAMUSCULAR | Status: AC
  Filled 2014-07-01: qty 30

## 2014-07-01 MED ORDER — MUPIROCIN 2 % EX OINT
TOPICAL_OINTMENT | CUTANEOUS | Status: AC
  Filled 2014-07-01: qty 22

## 2014-07-01 MED ORDER — ASPIRIN EC 81 MG PO TBEC
81.0000 mg | DELAYED_RELEASE_TABLET | Freq: Every day | ORAL | Status: DC
  Administered 2014-07-01 – 2014-07-02 (×2): 81 mg via ORAL
  Filled 2014-07-01 (×2): qty 1

## 2014-07-01 MED ORDER — SODIUM CHLORIDE 0.9 % IJ SOLN
3.0000 mL | Freq: Two times a day (BID) | INTRAMUSCULAR | Status: DC
  Administered 2014-07-01 – 2014-07-02 (×2): 3 mL via INTRAVENOUS

## 2014-07-01 MED ORDER — FENTANYL CITRATE 0.05 MG/ML IJ SOLN
INTRAMUSCULAR | Status: AC
  Filled 2014-07-01: qty 2

## 2014-07-01 MED ORDER — SODIUM CHLORIDE 0.9 % IJ SOLN: 3.0000 mL | INTRAMUSCULAR | Status: DC | PRN

## 2014-07-01 MED ORDER — HYDROCODONE-ACETAMINOPHEN 5-325 MG PO TABS
1.0000 | ORAL_TABLET | Freq: Four times a day (QID) | ORAL | Status: DC | PRN
  Administered 2014-07-01 – 2014-07-02 (×4): 1 via ORAL
  Filled 2014-07-01 (×4): qty 1

## 2014-07-01 MED ORDER — ONDANSETRON HCL 4 MG/2ML IJ SOLN: 4.0000 mg | Freq: Four times a day (QID) | INTRAMUSCULAR | Status: DC | PRN

## 2014-07-01 MED ORDER — CARVEDILOL 6.25 MG PO TABS
6.2500 mg | ORAL_TABLET | Freq: Two times a day (BID) | ORAL | Status: DC
  Administered 2014-07-01 – 2014-07-02 (×3): 6.25 mg via ORAL
  Filled 2014-07-01 (×4): qty 1

## 2014-07-01 MED ORDER — MIDAZOLAM HCL 5 MG/5ML IJ SOLN
INTRAMUSCULAR | Status: AC
  Filled 2014-07-01: qty 5

## 2014-07-01 MED ORDER — IRBESARTAN 150 MG PO TABS
150.0000 mg | ORAL_TABLET | Freq: Every day | ORAL | Status: DC
Start: 2014-07-01 — End: 2014-07-02
  Administered 2014-07-01 – 2014-07-02 (×2): 150 mg via ORAL
  Filled 2014-07-01 (×3): qty 1

## 2014-07-01 MED ORDER — CEFAZOLIN SODIUM-DEXTROSE 2-3 GM-% IV SOLR
INTRAVENOUS | Status: AC
  Filled 2014-07-01: qty 50

## 2014-07-01 MED ORDER — ACETAMINOPHEN 325 MG PO TABS
325.0000 mg | ORAL_TABLET | ORAL | Status: DC | PRN
  Administered 2014-07-01: 650 mg via ORAL
  Filled 2014-07-01: qty 2

## 2014-07-01 MED ORDER — MUPIROCIN 2 % EX OINT
TOPICAL_OINTMENT | Freq: Two times a day (BID) | CUTANEOUS | Status: DC
  Administered 2014-07-01: 12:00:00 via NASAL
  Filled 2014-07-01: qty 22

## 2014-07-01 MED ORDER — SPIRONOLACTONE 12.5 MG HALF TABLET
12.5000 mg | ORAL_TABLET | Freq: Every day | ORAL | Status: DC
  Administered 2014-07-01 – 2014-07-02 (×2): 12.5 mg via ORAL
  Filled 2014-07-01 (×2): qty 1

## 2014-07-01 MED ORDER — HEPARIN (PORCINE) IN NACL 2-0.9 UNIT/ML-% IJ SOLN
INTRAMUSCULAR | Status: AC
  Filled 2014-07-01: qty 1000

## 2014-07-01 MED ORDER — FUROSEMIDE 20 MG PO TABS
20.0000 mg | ORAL_TABLET | Freq: Every day | ORAL | Status: DC
  Administered 2014-07-02: 20 mg via ORAL
  Filled 2014-07-01 (×2): qty 1

## 2014-07-01 MED ORDER — SODIUM CHLORIDE 0.9 % IV SOLN: 250.0000 mL | INTRAVENOUS | Status: DC | PRN

## 2014-07-01 NOTE — Progress Notes (Signed)
Doing well s/p ICD implant for primary prevention.  Per Dr Excell Seltzer, OK to hold brilinta for 24 hours.   Please resumenbrilinta 07/03/14 am.  Discharge to home in am with routine wound care and follow-up

## 2014-07-01 NOTE — Interval H&P Note (Signed)
History and Physical Interval Note:  07/01/2014 12:16 PM  Austin Allen  has presented today for surgery, with the diagnosis of lv disfunction/cm  The various methods of treatment have been discussed with the patient and family. After consideration of risks, benefits and other options for treatment, the patient has consented to  Procedure(s): IMPLANTABLE CARDIOVERTER DEFIBRILLATOR IMPLANT (N/A) as a surgical intervention .  The patient's history has been reviewed, patient examined, no change in status, stable for surgery.  I have reviewed the patient's chart and labs.  Questions were answered to the patient's satisfaction.     ICD Criteria  Current LVEF:20% ;Obtained > 3 months ago and < or = 6 months ago.   NYHA Functional Classification: Class III  Heart Failure History:  Yes, Duration of heart failure since onset is 3 to 9 months  Non-Ischemic Dilated Cardiomyopathy History:  No.  Atrial Fibrillation/Atrial Flutter:  No.  Ventricular Tachycardia History:  Yes, No hemodynamic instability. VT Type:  NSVT.  Cardiac Arrest History:  No  History of Syndromes with Risk of Sudden Death:  No.  Previous ICD:  No.  Electrophysiology Study: No.  Prior MI: Yes, Most recent MI timeframe is > 40 days.  PPM: No.  OSA:  No  Patient Life Expectancy of >=1 year: Yes.  Anticoagulation Therapy:  Patient is NOT on anticoagulation therapy.   Beta Blocker Therapy:  Yes.   Ace Inhibitor/ARB Therapy:  Yes.    Hillis Range

## 2014-07-01 NOTE — H&P (View-Only) (Signed)
Referring Physician:  Dr Gillian Shieldsooper  Austin Allen is a 58 y.o. male with a h/o CAD, ischemic CM and NYHA Class III CHF who presents today for EP consultation regarding risks of sudden death.  He reports being in good health until 02/19/14 when he presented with acute MI.  He underwent PCI at that time.  He had a lifevest placed.  He has been treated with an optimal medical regimen but his EF remains depressed.  He reports SOB with moderate activity.  He can presently walk about a half mile at a time.  Today, he denies symptoms of palpitations, chest pain, orthopnea, PND, lower extremity edema, dizziness, presyncope, syncope, or neurologic sequela. The patient is tolerating medications without difficulties and is otherwise without complaint today.   Past Medical History  Diagnosis Date  . CAD (coronary artery disease)     a. 02/19/14 STEMI s/p PCI with DES to LAD   b.  02/19/14 s/p relook LHC with patent LAD stent and treated medically.    Marland Kitchen. HLD (hyperlipidemia)   . Ischemic cardiomyopathy     a. ECHO 02/20/14 with EF 25-30% and put on life vest   . HTN (hypertension)   . Tobacco abuse   . Overweight(278.02)    No past surgical history on file.  Current Outpatient Prescriptions  Medication Sig Dispense Refill  . aspirin EC 81 MG EC tablet Take 1 tablet (81 mg total) by mouth daily.      . carvedilol (COREG) 6.25 MG tablet Take 1 tablet (6.25 mg total) by mouth 2 (two) times daily with a meal.  60 tablet  11  . furosemide (LASIX) 20 MG tablet Take 1 tablet (20 mg total) by mouth daily.  30 tablet  11  . olmesartan (BENICAR) 20 MG tablet Take 1 tablet (20 mg total) by mouth daily.      . simvastatin (ZOCOR) 20 MG tablet Take 1 tablet (20 mg total) by mouth at bedtime.  30 tablet  6  . spironolactone (ALDACTONE) 25 MG tablet Take 0.5 tablets (12.5 mg total) by mouth daily.  30 tablet  11  . Ticagrelor (BRILINTA) 90 MG TABS tablet Take 1 tablet (90 mg total) by mouth 2 (two) times daily.  60 tablet   11   No current facility-administered medications for this visit.    No Known Allergies  History   Social History  . Marital Status: Single    Spouse Name: N/A    Number of Children: N/A  . Years of Education: N/A   Occupational History  . Not on file.   Social History Main Topics  . Smoking status: Former Smoker    Start date: 02/18/2014  . Smokeless tobacco: Not on file  . Alcohol Use: Yes     Comment: quit since his MI  . Drug Use: Yes     Comment: occasional marijuana  . Sexual Activity: Not on file   Other Topics Concern  . Not on file   Social History Narrative   [place]ives alone in [place]   Previously worked for [employer] but has been unemployed since his recent MI.  He is working towards disability at this time    Family History  Problem Relation Age of Onset  . Coronary artery disease      ROS- All systems are reviewed and negative except as per the HPI above  Physical Exam: Filed Vitals:   06/27/14 0902  BP: 123/85  Pulse: 65  Height: 6\' 4"  (1.93  m)  Weight: 258 lb (117.028 kg)    GEN- The patient is well appearing, alert and oriented x 3 today.   He is wearing a lifevest today. Head- normocephalic, atraumatic Eyes-  Sclera clear, conjunctiva pink Ears- hearing intact Oropharynx- clear Neck- supple, no JVP Lymph- no cervical lymphadenopathy Lungs- Clear to ausculation bilaterally, normal work of breathing Heart- Regular rate and rhythm, no murmurs, rubs or gallops, PMI not laterally displaced GI- soft, NT, ND, + BS Extremities- no clubbing, cyanosis, or edema MS- no significant deformity or atrophy Skin- no rash or lesion Psych- euthymic mood, full affect Neuro- strength and sensation are intact  EKG today reveals sinus rhythm 65 bpm, PR 180, QRS 98. Qtc 432, anteroseptal infarct, LAD Echo 05/30/14- EF 20%, mild MR  Assessment and Plan:  1. CAD/ischemic CM/ chronic systolic dysfunction The patient has an ischemic CM (EF 20%), NYHA Class  III CHF, and CAD. At this time, he meets MADIT II/ SCD-HeFT criteria for ICD implantation for primary prevention of sudden death.  Risks, benefits, alternatives to ICD implantation were discussed in detail with the patient today. The patient  understands that the risks include but are not limited to bleeding, infection, pneumothorax, perforation, tamponade, vascular damage, renal failure, MI, stroke, death, inappropriate shocks, and lead dislodgement and wishes to proceed. His QRS < 130 msec and therefore he does not meet criteria for CRT.   We will therefore schedule ICD implantation at the next available time.

## 2014-07-01 NOTE — Op Note (Signed)
SURGEON:  Hillis RangeJames Trypp Heckmann, MD      PREPROCEDURE DIAGNOSES:   1. Ischemic cardiomyopathy.   2. New York Heart Association class III, heart failure chronically.      POSTPROCEDURE DIAGNOSES:   1. Ischemic cardiomyopathy.   2. New York Heart Association class III heart failure chronically.      PROCEDURES:    1. ICD implantation.  2. Left upper extremity venography  3. Defibrillation threshold testing     INTRODUCTION: Austin Allen Patient is a 58 y.o. male with an ischemic CM (EF20%), NYHA Class III CHF, and CAD. At this time, he meets MADIT II/ SCD-HeFT criteria for ICD implantation for primary prevention of sudden death.  The patient has a narrow QRS and does not meet criteria for revascularization.  The patient has been treated with an optimal medical regimen but continues to have a depressed ejection fraction and NYHA Class III CHF symptoms.  The patient therefore  presents today for ICD implantation.      DESCRIPTION OF PROCEDURE:  Informed written consent was obtained and the patient was brought to the electrophysiology lab in the fasting state. The patient was adequately sedated with intravenous Versed, and fentanyl as outlined in the nursing report.  The patient's left chest was prepped and draped in the usual sterile fashion by the EP lab staff.  The skin overlying the left deltopectoral region was infiltrated with lidocaine for local analgesia.  A 5-cm incision was made over the left deltopectoral region.  A left subcutaneous defibrillator pocket was fashioned using a combination of sharp and blunt dissection.  Electrocautery was used to assure hemostasis.   Left Upper extremity Venography:  A venogram of the left upper extremity was performed which revealed a large sized left axillary vein which emptied into a moderate sized left subclavian vein.    RA/RV Lead Placement: The left axillary vein was cannulated with fluoroscopic visualization.  Through the left axillary vein, a St. Jude Medical  AuroraDurata, model [id number] (serial number [id number]) right ventricular defibrillator lead was advanced with fluoroscopic visualization into the right ventricular apex position.   The right ventricular lead R-wave measured 14.8 mV with impedance of 796 ohms and a threshold of 0.7 volts at 0.5 milliseconds.   The lead was secured to the pectoralis  fascia using #2 silk suture over the suture sleeve.  The pocket then  irrigated with copious gentamicin solution.  The lead was then connected to a [facility] Fortify Assura VR model [id number] (serial  Number [id number]) ICD.  The defibrillator was placed into the  pocket.  The pocket was then closed in 2 layers with 2.0 Vicryl suture  for the subcutaneous and subcuticular layers.  Steri-Strips and a  sterile dressing were then applied.   DFT Testing: Defibrillation Threshold testing was then performed. Ventricular fibrillation was induced with a T shock.  Adequate sensing of ventricular  fibrillation was observed with minimal dropout with a programmed sensitivity of 1.200mV.  The patient was successfully defibrillated to sinus rhythm with a single 20 joules shock delivered from the device with an impedance of 68 ohms in a duration of 6 seconds.  The patient remained in sinus rhythm thereafter.  There were no early apparent complications.  Programmed Extrastimulus testing:  Programmed extrastimulus testing was performed through the device with a basic cycle length of 500msec with S1,S2,S3,S4 extrastimuli down to refractoriness (500/250/230/200 msec) with no sustained VT or VF observed.  The procedure was therefore considered completed.  There were no early apparhent  complications.     CONCLUSIONS:   1. Ischemic cardiomyopathy with chronic New York Heart Association class III heart failure.   2. Successful ICD implantation.   3. DFT less than or equal to 20 joules.   4. No inducible VT or VF with PES  5. No early apparent complications.

## 2014-07-02 ENCOUNTER — Encounter (HOSPITAL_COMMUNITY): Payer: Self-pay | Admitting: Nurse Practitioner

## 2014-07-02 ENCOUNTER — Ambulatory Visit (HOSPITAL_COMMUNITY): Payer: Medicaid Other

## 2014-07-02 DIAGNOSIS — I509 Heart failure, unspecified: Secondary | ICD-10-CM | POA: Diagnosis not present

## 2014-07-02 DIAGNOSIS — I5022 Chronic systolic (congestive) heart failure: Secondary | ICD-10-CM | POA: Diagnosis present

## 2014-07-02 DIAGNOSIS — I2510 Atherosclerotic heart disease of native coronary artery without angina pectoris: Secondary | ICD-10-CM | POA: Diagnosis not present

## 2014-07-02 DIAGNOSIS — I2589 Other forms of chronic ischemic heart disease: Secondary | ICD-10-CM | POA: Diagnosis not present

## 2014-07-02 LAB — BASIC METABOLIC PANEL
ANION GAP: 12 (ref 5–15)
BUN: 23 mg/dL (ref 6–23)
CHLORIDE: 102 meq/L (ref 96–112)
CO2: 22 meq/L (ref 19–32)
CREATININE: 1.29 mg/dL (ref 0.50–1.35)
Calcium: 8.6 mg/dL (ref 8.4–10.5)
GFR calc Af Amer: 70 mL/min — ABNORMAL LOW (ref >90)
GFR calc non Af Amer: 60 mL/min — ABNORMAL LOW (ref >90)
Glucose, Bld: 118 mg/dL — ABNORMAL HIGH (ref 70–99)
POTASSIUM: 4.3 meq/L (ref 3.7–5.3)
Sodium: 136 mEq/L — ABNORMAL LOW (ref 137–147)

## 2014-07-02 MED ORDER — HYDROCODONE-ACETAMINOPHEN 5-325 MG PO TABS: 1.0000 | ORAL_TABLET | Freq: Four times a day (QID) | ORAL | Status: AC | PRN

## 2014-07-02 MED ORDER — TICAGRELOR 90 MG PO TABS: 90.0000 mg | ORAL_TABLET | Freq: Two times a day (BID) | ORAL | Status: DC

## 2014-07-02 NOTE — Progress Notes (Signed)
Results reviewed and instructions given

## 2014-07-02 NOTE — Progress Notes (Signed)
Patient Name: Austin Allen Date of Encounter: 07/02/2014    Principal Problem:   Ischemic Cardiomyopathy Active Problems:   Chronic systolic heart failure   CAD (coronary artery disease)   HTN (hypertension)   Hyperlipidemia    SUBJECTIVE  S/P ICD yesterday.  Sore over insertion site but otw stable.  No chest pain or dyspnea.  CURRENT MEDS . aspirin EC  81 mg Oral Daily  . carvedilol  6.25 mg Oral BID WC  . furosemide  20 mg Oral Daily  . irbesartan  150 mg Oral Daily  . sodium chloride  3 mL Intravenous Q12H  . spironolactone  12.5 mg Oral Daily   OBJECTIVE  Filed Vitals:   07/01/14 1758 07/01/14 2100 07/02/14 0500 07/02/14 0810  BP: 124/78 101/65 91/68 104/68  Pulse: 70 79 75   Temp:  97.9 F (36.6 C) 98.1 F (36.7 C)   TempSrc:      Resp:  16 16   Height:      Weight:   261 lb 12.8 oz (118.752 kg)   SpO2:  94% 94%     Intake/Output Summary (Last 24 hours) at 07/02/14 0853 Last data filed at 07/01/14 2109  Gross per 24 hour  Intake    483 ml  Output    450 ml  Net     33 ml   Filed Weights   07/01/14 1119 07/02/14 0500  Weight: 250 lb (113.399 kg) 261 lb 12.8 oz (118.752 kg)    PHYSICAL EXAM  General: Pleasant, NAD. Neuro: Alert and oriented X 3. Moves all extremities spontaneously. Psych: Normal affect. HEENT:  Normal  Neck: Supple without bruits or JVD. Lungs:  Resp regular and unlabored, diminished breath sounds @ bases. Heart: RRR no s3, s4, or murmurs. Abdomen: Soft, non-tender, non-distended, BS + x 4.  Extremities: No clubbing, cyanosis or edema. DP/PT/Radials 2+ and equal bilaterally.  Accessory Clinical Findings  Basic Metabolic Panel  Recent Labs  07/02/14 0438  NA 136*  K 4.3  CL 102  CO2 22  GLUCOSE 118*  BUN 23  CREATININE 1.29  CALCIUM 8.6   TELE  RSR w/ pacing on demand.  ECG  Rsr, 75, left axis, ant infarct, mild lat st elev (unchanged).  Radiology/Studies  Dg Chest 2 View  07/02/2014   CLINICAL DATA:  Status  post device implantation.  EXAM: CHEST  2 VIEW  COMPARISON:  02/20/2014  FINDINGS: A new left-sided single lead AICD has been placed with lead projecting in the region of the right ventricle. Cardiac silhouette is upper limits of normal in size. There is persistent, mild elevation of the right hemidiaphragm. Parenchymal lung opacities on the prior study have resolved. There is no evidence of new airspace consolidation, pulmonary edema, pleural effusion, or pneumothorax. Lateral radiograph is limited by patient's inability to raise the arm. No acute osseous abnormality is seen.  IMPRESSION: 1. New left-sided AICD as above. 2. No evidence of acute airspace disease. Interval clearing of parenchymal infiltrates on the prior study.   Electronically Signed   By: Sebastian Ache   On: 07/02/2014 07:11    ASSESSMENT AND PLAN  1.  ICM/Chronic Systolic CHF:  EF 20-25% by echo 6/29 with resultant EP f/u and ICD placement yesterday.  CXR this AM w/o PTX.  Site is sore - will provide prn vicodin for next 1-2 days.  Euvolemic on exam.  Cont bb, arb, spiro, lasix.  Plan d/c today with device clinic f/u in ~ 10 days  and EP f/u in 3 mos.  F/U Dr. Excell Seltzerooper or APP in 1-2 mos.  2.  CAD:  S/p PCI/DES of LAD in March with patency of stent shown on relook cath, also in March.  No chest pain.  Cont asa, statin (on @ home - ? not ordered here), bb.  Per Drs. Allred/Cooper, resume brilinta on morning of 8/2.  3.  HTN:  Stable.  4.  HL:  LDL 32 05/30/2014, with nl LFTs.  Cont home dose of simvastatin.  5.  Dispo:  D/C today.  Signed,   Darden PalmerW. Spencer Gryffin Altice, Jr. MD [facility]

## 2014-07-02 NOTE — Discharge Instructions (Signed)
**  PLEASE REMEMBER TO BRING ALL OF YOUR MEDICATIONS TO EACH OF YOUR FOLLOW-UP OFFICE VISITS.     Supplemental Discharge Instructions for  Pacemaker/Defibrillator Patients  Activity No heavy lifting or vigorous activity with your left/right arm for 6 to 8 weeks.  Do not raise your left/right arm above your head for one week.  Gradually raise your affected arm as drawn below.           _           8/6                               8/7                          8/8                          8/9            NO DRIVING for     ; you may begin driving on   8/9  .  WOUND CARE   Keep the wound area clean and dry.  Do not get this area wet for one week. No showers for one week; you may shower on 8/9  .   The tape/steri-strips on your wound will fall off; do not pull them off.  No bandage is needed on the site.  DO  NOT apply any creams, oils, or ointments to the wound area.   If you notice any drainage or discharge from the wound, any swelling or bruising at the site, or you develop a fever > 101? F after you are discharged home, call the office at once.  Special Instructions   You are still able to use cellular telephones; use the ear opposite the side where you have your pacemaker/defibrillator.  Avoid carrying your cellular phone near your device.   When traveling through airports, show security personnel your identification card to avoid being screened in the metal detectors.  Ask the security personnel to use the hand wand.   Avoid arc welding equipment, MRI testing (magnetic resonance imaging), TENS units (transcutaneous nerve stimulators).  Call the office for questions about other devices.   Avoid electrical appliances that are in poor condition or are not properly grounded.   Microwave ovens are safe to be near or to operate.  Additional information for defibrillator patients should your device go off:   If your device goes off ONCE and you feel fine afterward, notify the device clinic  nurses.   If your device goes off ONCE and you do not feel well afterward, call 911.   If your device goes off TWICE, call 911.   If your device goes off THREE times in one day, call 911.  DO NOT DRIVE YOURSELF OR A FAMILY MEMBER WITH A DEFIBRILLATOR TO THE HOSPITAL--CALL 911.

## 2014-07-02 NOTE — Discharge Summary (Signed)
Discharge Summary   Patient ID: Austin Allen,  MRN: [id number], DOB/AGE: 1956-01-19 58 y.o.  Admit date: 07/01/2014 Discharge date: 07/02/2014  Primary Care Provider: Default, Provider Primary Cardiologist: M. Excell Seltzer, MD / Shela Commons. Allred, MD   Discharge Diagnoses Principal Problem:   Ischemic Cardiomyopathy  **S/P St. Jude Medical AICD placement this admission.  **EF 20-25%. Active Problems:   Chronic systolic heart failure   CAD (coronary artery disease)   HTN (hypertension)   Hyperlipidemia  Allergies No Known Allergies  Procedures  AICD Placement 7/31.2015  St. Jude Medical [place] VR model [id number] (serial  Number [id number]) ICD _____________   History of Present Illness  58 y/o male with a h/o CAD s/p anterior MI requiring drug-eluting stent placement of the LAD in March of 2015.  Following MI, EF was measured @ 25-30% and patient was placed on maximal medical therapy and a LifeVest was placed.  Follow-up EF by echocardiography on 05/30/2014 remained depressed @ 20-25% and so decision was made to refer to electrophysiology for consideration of ICD placement.  He was seen by EP on 7/27, and arrangements were made for elective ICD placement for 7/31.  Hospital Course  Patient presented to the [facility] EP laboratory and underwent successful placement of a St. Jude Medical Fortify Assura VR dual-chamber AICD.  Of note, his Austin Allen was held on the day of the procedure with a plan to resume it on 07/03/2014.  He tolerated this procedure well and post-procedure CXR shows no evidence of pneumothorax.  He is euvolemic on exam.  He has had some incisional discomfort, which has been successfully treated with oral vicodin.  He will be discharged home today in good condition.  He will resume Brilinta tomorrow morning and follow-up in device clinic in approximately 7-10 days.  Discharge Vitals Blood pressure 104/68, pulse 75, temperature 98.1 F (36.7 C), temperature source Oral,  resp. rate 16, height 6\' 4"  (1.93 m), weight 261 lb 12.8 oz (118.752 kg), SpO2 94.00%.   ICD site clean and dry,  Lungs, clear, no S3 Filed Weights   07/01/14 1119 07/02/14 0500  Weight: 250 lb (113.399 kg) 261 lb 12.8 oz (118.752 kg)    Labs  Basic Metabolic Panel  Recent Labs  07/02/14 0438  NA 136*  K 4.3  CL 102  CO2 22  GLUCOSE 118*  BUN 23  CREATININE 1.29  CALCIUM 8.6   CXR:  stable Disposition  Pt is being discharged home today in good condition.  Follow-up Plans & Appointments  Follow-up Information   Follow up with [facility] In 10 days. (We will arrange and contact you.)    Contact information:   [address] [place] [phone]      Follow up with Hillis Range, MD In 3 months. (We will arrange and contact you.)    Specialty:  Cardiology   Contact information:   [address] [place] [address] [phone]       Follow up with [name], MD. (4-6 wks, We will arrange and contact you.)    Specialty:  Cardiology   Contact information:   [address] [place] [address] [phone]       Discharge Medications    Medication List         aspirin 81 MG EC tablet  Take 1 tablet (81 mg total) by mouth daily.     carvedilol 6.25 MG tablet  Commonly known as:  COREG  Take 1 tablet (6.25 mg total) by mouth 2 (two) times daily with a meal.     furosemide 20 MG tablet  Commonly known as:  LASIX  Take 1 tablet (20 mg total) by mouth daily.     HYDROcodone-acetaminophen 5-325 MG per tablet  Commonly known as:  NORCO/VICODIN  Take 1 tablet by mouth every 6 (six) hours as needed for moderate pain.     olmesartan 20 MG tablet  Commonly known as:  BENICAR  Take 1 tablet (20 mg total) by mouth daily.     simvastatin 20 MG tablet  Commonly known as:  ZOCOR  Take 1 tablet (20 mg total) by mouth at bedtime.     spironolactone 25 MG tablet  Commonly known as:  ALDACTONE    Take 0.5 tablets (12.5 mg total) by mouth daily.     ticagrelor 90 MG Tabs tablet  Commonly known as:  BRILINTA  Take 1 tablet (90 mg total) by mouth 2 (two) times daily.       Outstanding Labs/Studies  None  Duration of Discharge Encounter   Greater than 30 minutes including physician time.  Signed,  Darden PalmerW. Spencer Lillyrose Reitan, Jr. MD [facility]

## 2014-07-07 ENCOUNTER — Ambulatory Visit (INDEPENDENT_AMBULATORY_CARE_PROVIDER_SITE_OTHER): Payer: Medicaid Other | Admitting: *Deleted

## 2014-07-07 DIAGNOSIS — I5022 Chronic systolic (congestive) heart failure: Secondary | ICD-10-CM

## 2014-07-07 LAB — MDC_IDC_ENUM_SESS_TYPE_INCLINIC
HIGH POWER IMPEDANCE MEASURED VALUE: 59.625
Implantable Pulse Generator Serial Number: 7208444
Lead Channel Impedance Value: 612.5 Ohm
Lead Channel Pacing Threshold Amplitude: 1 V
Lead Channel Pacing Threshold Amplitude: 1 V
Lead Channel Pacing Threshold Pulse Width: 0.5 ms
Lead Channel Sensing Intrinsic Amplitude: 11.7 mV
Lead Channel Setting Pacing Amplitude: 3.5 V
Lead Channel Setting Pacing Pulse Width: 0.5 ms
Lead Channel Setting Sensing Sensitivity: 0.5 mV
MDC IDC MSMT BATTERY REMAINING LONGEVITY: 105.6 mo
MDC IDC MSMT LEADCHNL RV PACING THRESHOLD PULSEWIDTH: 0.5 ms
MDC IDC SESS DTM: 20150806165028
MDC IDC SET ZONE DETECTION INTERVAL: 250 ms
MDC IDC SET ZONE DETECTION INTERVAL: 300 ms
MDC IDC STAT BRADY RV PERCENT PACED: 0.02 %
Zone Setting Detection Interval: 345 ms

## 2014-07-07 NOTE — Progress Notes (Signed)
Wound check appointment.  Wound without redness or edema. Some bleeding noted from proximal end of the incision, steri-strips reapplied.  Normal device function. Thresholds, sensing, and impedances consistent with implant measurements. Device programmed at 3.5V for extra safety margin until 3 month visit. Histogram distribution appropriate for patient and level of activity. No mode switches or ventricular arrhythmias noted. Patient educated about wound care, arm mobility, lifting restrictions, shock plan. ROV 8/13 for recheck.

## 2014-07-13 ENCOUNTER — Telehealth: Payer: Self-pay | Admitting: *Deleted

## 2014-07-13 NOTE — Telephone Encounter (Signed)
Benicar samples placed at the front desk for pick up. 

## 2014-07-14 ENCOUNTER — Ambulatory Visit (INDEPENDENT_AMBULATORY_CARE_PROVIDER_SITE_OTHER): Payer: Medicaid Other | Admitting: *Deleted

## 2014-07-14 DIAGNOSIS — Z95810 Presence of automatic (implantable) cardiac defibrillator: Secondary | ICD-10-CM

## 2014-07-15 LAB — MDC_IDC_ENUM_SESS_TYPE_INCLINIC
Brady Statistic RV Percent Paced: 0.03 %
Date Time Interrogation Session: 20150813194241
HIGH POWER IMPEDANCE MEASURED VALUE: 64.125
Implantable Pulse Generator Serial Number: 7208444
Lead Channel Pacing Threshold Amplitude: 1 V
Lead Channel Sensing Intrinsic Amplitude: 11.7 mV
Lead Channel Setting Sensing Sensitivity: 0.5 mV
MDC IDC MSMT BATTERY REMAINING LONGEVITY: 105.6 mo
MDC IDC MSMT LEADCHNL RV IMPEDANCE VALUE: 612.5 Ohm
MDC IDC MSMT LEADCHNL RV PACING THRESHOLD AMPLITUDE: 1 V
MDC IDC MSMT LEADCHNL RV PACING THRESHOLD PULSEWIDTH: 0.5 ms
MDC IDC MSMT LEADCHNL RV PACING THRESHOLD PULSEWIDTH: 0.5 ms
MDC IDC SET LEADCHNL RV PACING AMPLITUDE: 3.5 V
MDC IDC SET LEADCHNL RV PACING PULSEWIDTH: 0.5 ms
MDC IDC SET ZONE DETECTION INTERVAL: 250 ms
MDC IDC SET ZONE DETECTION INTERVAL: 300 ms
Zone Setting Detection Interval: 345 ms

## 2014-07-15 NOTE — Progress Notes (Signed)
Re-wound check appointment. Steri-strips removed. Wound without redness or edema. Incision edges approximated, wound well healed. Normal device function. Thresholds, sensing, and impedances consistent with implant measurements. Device programmed at 3.5V for extra safety margin until 3 month visit. Histogram distribution appropriate for patient and level of activity. No ventricular arrhythmias noted. Patient educated about wound care, arm mobility, lifting restrictions, shock plan. ROV w/ Dr. Johney Frame 10/17/14.

## 2014-07-21 ENCOUNTER — Encounter: Payer: Self-pay | Admitting: Internal Medicine

## 2014-07-27 ENCOUNTER — Encounter: Payer: Self-pay | Admitting: Internal Medicine

## 2014-07-28 ENCOUNTER — Telehealth: Payer: Self-pay | Admitting: Cardiovascular Disease

## 2014-07-28 ENCOUNTER — Telehealth: Payer: Self-pay | Admitting: Internal Medicine

## 2014-07-28 NOTE — Telephone Encounter (Signed)
Patient is moving but will be back for his scheduled appointments with Dr Excell Seltzer and Dr Johney Frame. Patient wanted to try to get more than 30 days at a time for current Rx's. Advised that could be written that way but it was up to Medicaid on how may days they would dispense and recommended  discussing with pharmacy.  Patient will call back if he needs anything else.

## 2014-07-28 NOTE — Telephone Encounter (Signed)
New problem   Pt need to speak to nurse concerning him moving to another state and what to do about his medications. Please call pt.

## 2014-07-28 NOTE — Telephone Encounter (Signed)
Left message for call back.

## 2014-07-28 NOTE — Telephone Encounter (Signed)
Follow up  ° ° ° °Returning call back to nurse  °

## 2014-07-28 NOTE — Telephone Encounter (Signed)
New message     Talk to the nurse---pt states that he is going out of town and need to talk to the nurse first

## 2014-07-29 NOTE — Telephone Encounter (Signed)
Pt moving to [facility] 08/10/14. Pt has no intent to return to [place] permanently unless his financial situation changes.  Pt plans to return to visit Dr. Excell Seltzer in October but does not plan to come back to November appt w/ Dr. Johney Frame. Pt will also call back to speak w/ Dr. Earmon Phoenix RN for extra prescription orders and samples prior to moving.

## 2014-07-29 NOTE — Telephone Encounter (Signed)
LMOVM w/ my direct line. 

## 2014-08-01 ENCOUNTER — Telehealth: Payer: Self-pay | Admitting: *Deleted

## 2014-08-01 NOTE — Telephone Encounter (Signed)
Aspirin and benicar samples placed at the front desk for pick up.

## 2014-08-05 ENCOUNTER — Telehealth: Payer: Self-pay

## 2014-08-05 NOTE — Telephone Encounter (Signed)
Patient came to office to get samples of brilinta

## 2014-08-09 ENCOUNTER — Other Ambulatory Visit: Payer: Self-pay | Admitting: *Deleted

## 2014-08-09 ENCOUNTER — Telehealth: Payer: Self-pay | Admitting: Physician Assistant

## 2014-08-09 MED ORDER — TICAGRELOR 90 MG PO TABS: 90.0000 mg | ORAL_TABLET | Freq: Two times a day (BID) | ORAL | Status: DC

## 2014-08-09 NOTE — Telephone Encounter (Signed)
The patient is a 58 year old male with past medical history of ischemic cardiomyopathy status post ICD placement at the end of July, he was discharged on August 3. The patient contacted the [facility] after hour answering service complaining of 2-3 day onset of cold like symptom. He was having runny nose, however denies any significant chest pain, shortness breath, fever or redness around ICD site. He states he has not had any issue with his ICD. And has not had any obvious sign of infection around ICD area. He is concerned about the runny nose and asking what he can take.  Given his recent ICD placement, patient is at high-risk of ICD infection, however it does not appear there is any active infection around ICD area. I would however aggressively treat the cold like symptom, I have recommended the patient to seek medical attention either tomorrow or the day after at local urgent care to be evaluated by medical personnel and potential start on antibiotic therapy. Of note, patient does not have a PCP.  Ramond Dial PA Pager: [phone]

## 2014-08-12 ENCOUNTER — Telehealth: Payer: Self-pay

## 2014-08-12 ENCOUNTER — Telehealth: Payer: Self-pay | Admitting: *Deleted

## 2014-08-12 NOTE — Telephone Encounter (Signed)
Called to get Approved. They said they had to send up to Dr to be approved. Here is the PA reference number, PA # [id number]

## 2014-08-12 NOTE — Telephone Encounter (Signed)
Patient called about his brilinta placed samples to front desk

## 2014-08-12 NOTE — Telephone Encounter (Signed)
[facility] stated that patient needs a prior authorization for the brilinta. This needs to be called in to cover my meds. The number to call is [phone] and the patients medicaid member id is [id number] Q. Thanks, MI

## 2014-09-05 ENCOUNTER — Telehealth: Payer: Self-pay

## 2014-09-05 NOTE — Telephone Encounter (Signed)
Patient called requesting samples of benicar 20 mg and brilintia placed samples at front desk. Gave him a month worth of samples

## 2014-09-07 ENCOUNTER — Encounter: Payer: Self-pay | Admitting: Cardiovascular Disease

## 2014-09-07 ENCOUNTER — Ambulatory Visit (INDEPENDENT_AMBULATORY_CARE_PROVIDER_SITE_OTHER): Payer: Medicaid Other | Admitting: Cardiovascular Disease

## 2014-09-07 VITALS — BP 128/64 | HR 82 | Ht 76.0 in | Wt 273.0 lb

## 2014-09-07 DIAGNOSIS — I255 Ischemic cardiomyopathy: Secondary | ICD-10-CM

## 2014-09-07 DIAGNOSIS — I2510 Atherosclerotic heart disease of native coronary artery without angina pectoris: Secondary | ICD-10-CM

## 2014-09-07 MED ORDER — CLOPIDOGREL BISULFATE 75 MG PO TABS: 75.0000 mg | ORAL_TABLET | Freq: Every day | ORAL | Status: AC

## 2014-09-07 MED ORDER — CARVEDILOL 12.5 MG PO TABS: 12.5000 mg | ORAL_TABLET | Freq: Two times a day (BID) | ORAL | Status: DC

## 2014-09-07 NOTE — Progress Notes (Signed)
HPI:  58 year old gentleman presenting for followup evaluation. In March 2015 the patient presented with an anterior wall STEMI and he underwent primary PCI of the LAD with a drug-eluting stent. His LVEF remained less than 30% despite optimal medical therapy. He wore LifeVest and ultimately underwent ICD placement 07/01/2014.  The patient is doing well. He has some generalized fatigue but no other complaints. He's had no problems related to his ICD. He denies chest pain, chest pressure, or shortness of breath. He had been walking regularly for about 25 minutes daily but has slacked off on this. He's had no exertional symptoms recently. He specifically denies lightheadedness, leg swelling, orthopnea, PND, or presyncope.  Outpatient Encounter Prescriptions as of 09/07/2014  Medication Sig  . aspirin EC 81 MG EC tablet Take 1 tablet (81 mg total) by mouth daily.  . carvedilol (COREG) 6.25 MG tablet Take 1 tablet (6.25 mg total) by mouth 2 (two) times daily with a meal.  . furosemide (LASIX) 20 MG tablet Take 1 tablet (20 mg total) by mouth daily.  Marland Kitchen. HYDROcodone-acetaminophen (NORCO/VICODIN) 5-325 MG per tablet Take 1 tablet by mouth every 6 (six) hours as needed for moderate pain.  Marland Kitchen. olmesartan (BENICAR) 20 MG tablet Take 1 tablet (20 mg total) by mouth daily.  . simvastatin (ZOCOR) 20 MG tablet Take 1 tablet (20 mg total) by mouth at bedtime.  Marland Kitchen. spironolactone (ALDACTONE) 25 MG tablet Take 0.5 tablets (12.5 mg total) by mouth daily.  . ticagrelor (BRILINTA) 90 MG TABS tablet Take 1 tablet (90 mg total) by mouth 2 (two) times daily.    No Known Allergies  Past Medical History  Diagnosis Date  . CAD (coronary artery disease)     a. 02/19/14 STEMI s/p PCI with DES to LAD   b.  02/19/14 s/p relook LHC with patent LAD stent and treated medically.    Marland Kitchen. HLD (hyperlipidemia)   . Ischemic cardiomyopathy     a. ECHO 02/20/14 with EF 25-30%->lifevest placed;  b. 05/2014 Echo: EF 20-25%,  mid-apicalanteroseptal AK, mild MR;  c. 06/2014 [employer] Fortify Assura VR model [id number] (ser # [id number]) ICD.  Marland Kitchen. HTN (hypertension)   . Tobacco abuse   . Overweight(278.02)   . Chronic systolic heart failure     a. 7/82956/2015 Echo: EF 20-25% mid-apicalanteroseptal AK.    ROS: Negative except as per HPI  BP 128/64  Pulse 82  Ht 6\' 4"  (1.93 m)  Wt 273 lb (123.832 kg)  BMI 33.24 kg/m2  PHYSICAL EXAM: Pt is alert and oriented, NAD HEENT: normal Neck: JVP - normal, carotids 2+= without bruits Lungs: CTA bilaterally Chest: ICD site is well healed CV: RRR without murmur or gallop Abd: soft, NT, Positive BS, no hepatomegaly Ext: no C/C/E, distal pulses intact and equal Skin: warm/dry no rash  2-D echocardiogram: Study Conclusions  - Left ventricle: The cavity size was normal. Wall thickness was normal. Systolic function was severely reduced. The estimated ejection fraction was in the range of 20% to 25%. Akinesis of the mid-apicalanteroseptal myocardium. - Mitral valve: There was mild regurgitation. - Left atrium: The atrium was mildly dilated. - Right atrium: The atrium was mildly dilated. - Pulmonary arteries: Systolic pressure was mildly increased. PA peak pressure: 38 mm Hg (S).  Lipids 05/30/2014: Lipid Panel     Component Value Date/Time   CHOL 77 05/30/2014 1451   TRIG 67.0 05/30/2014 1451   HDL 31.50* 05/30/2014 1451   CHOLHDL 2 05/30/2014 1451   VLDL 13.4 05/30/2014  1451   LDLCALC 32 05/30/2014 1451   ASSESSMENT AND PLAN: 1. Coronary artery disease, native vessel. The patient is stable without symptoms of angina. He is now on Medicare and is unable to get brilinta. I think he can be switched to clopidogrel. He should continue this through March 2016, then can stop when he is in 12 months from his MI. He should remain on lifelong aspirin 81 mg.  2. Chronic systolic heart failure/severe ischemic cardiomyopathy. I would like to increase his carvedilol to 12.5 mg twice daily. He  will remain on Benicar and spinal. He can stop low-dose furosemide.  3. Hyperlipidemia. Continue simvastatin 20 mg daily  4. Status post ICD. Scheduled followup with Dr. Johney Frame 10/17/2014. His ICD site looks good with no problems.  The patient is going to move back to [place] next month after he sees Dr. Johney Frame. I would be happy to see him anytime in the future if he is back in the area. I will refill his cardiac medications for up to one year if he needs this.  [name] 09/07/2014 4:11 PM

## 2014-09-07 NOTE — Patient Instructions (Addendum)
Your physician has recommended you make the following change in your medication:  1. Please finish current supply of Brilinta and then switch to Plavix (clopidogrel) 75mg  take one by mouth daily.  You can STOP Plavix in MARCH 2016 2. STOP Furosemide (Lasix) 3. INCREASE Carvedilol to 12.5mg  take one by mouth twice a day   Please call the office with any questions and good luck with your move!!!

## 2014-10-11 ENCOUNTER — Telehealth: Payer: Self-pay | Admitting: Cardiovascular Disease

## 2014-10-11 NOTE — Telephone Encounter (Signed)
Spoke with pt ,[place] has moved to [place], and needs to cancel appointment with Dr. Johney Frame on 10/17/14. Pt is aware that when he finds a new cardiologist he is to sign a released form to send to  this office so we can send the records to his new cardiologist. Pt verbalized understanding.

## 2014-10-11 NOTE — Telephone Encounter (Signed)
I spoke with the pt and he has finally made it to [place] and is now in the process of applying for Medicaid. The pt is almost out of Brilinta and Benicar.  When the pt finished his current supply of Brilinta the pt was suppose to switch to Plavix. The pt cannot afford to pick up a Rx for plavix. I made the pt aware that I can place samples of Brilinta and Benicar at the front desk for a family member to pick-up. The pt's family can then mail the samples to him in [place].  The family member will need to provide ID to sign out samples.

## 2014-10-11 NOTE — Telephone Encounter (Deleted)
Atempted to call pt, I dialed the number given several times to make sure to the phone given [phone] got [facility] office. The person I spoked with did not know pt.

## 2014-10-11 NOTE — Telephone Encounter (Signed)
New Message        Pt calling stating that he moved out of town and has some questions to ask Dr. Johney Frame or his nurse in regards to his treatment. Please call pt back and advise.

## 2014-10-11 NOTE — Telephone Encounter (Signed)
New message     [place].  He is almost out of medication and want samples.  He want to know what can he do to get samples.  He has no insurance.

## 2014-10-14 NOTE — Telephone Encounter (Signed)
Samples were picked up by Michelle [place] on 10/13/14.

## 2014-10-17 ENCOUNTER — Encounter: Payer: Medicaid Other | Admitting: Internal Medicine

## 2014-11-10 ENCOUNTER — Encounter (HOSPITAL_COMMUNITY): Payer: Self-pay | Admitting: Cardiovascular Disease

## 2015-03-18 IMAGING — CR DG CHEST 2V
2 series · 2 of 2 positions shown · non-contrast
Comparison: 02/20/2014

CLINICAL DATA: Status post device implantation.

EXAM:
CHEST  2 VIEW

[w chest pa]
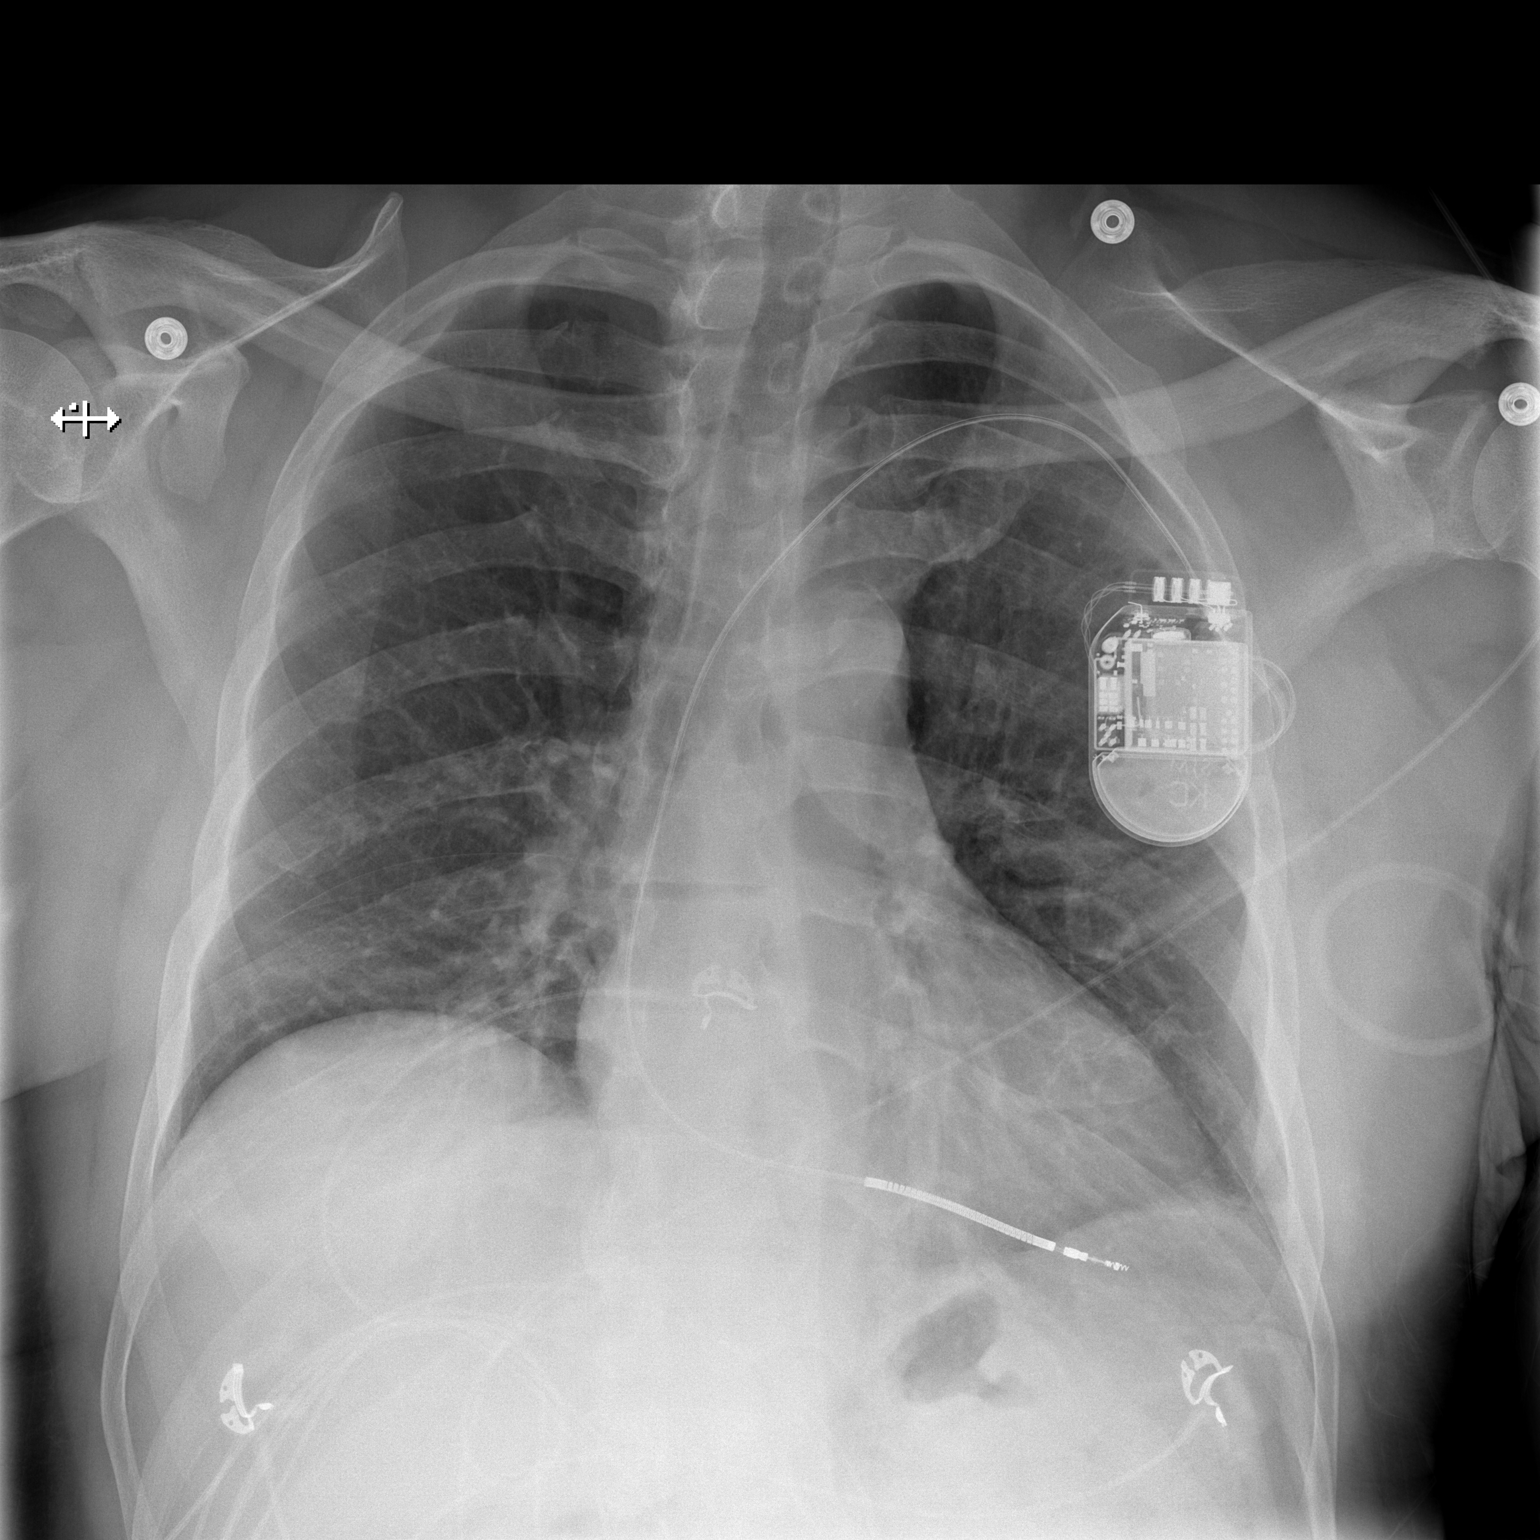

[w chest lat]
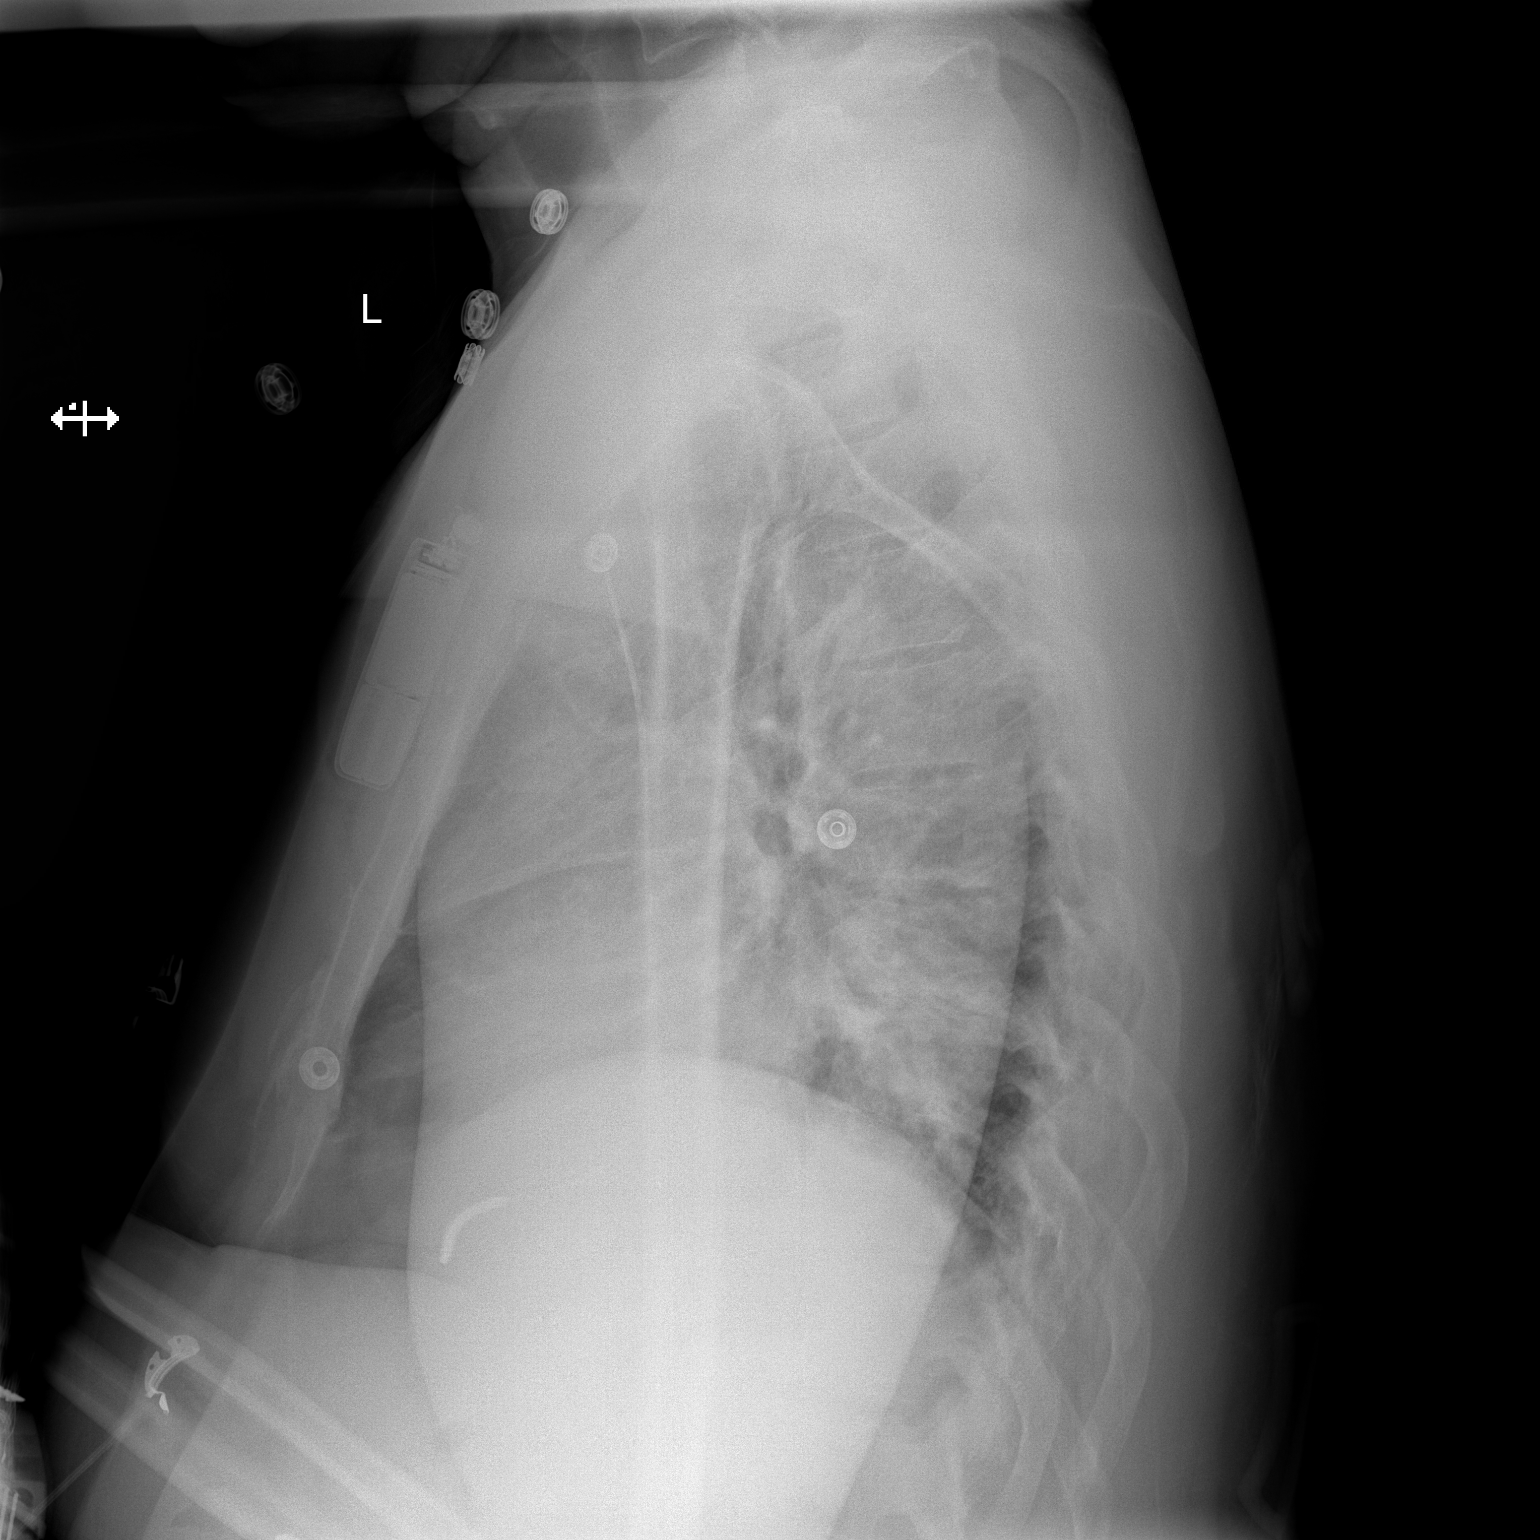

[2 of 2 positions shown; findings below may reference images not displayed]

FINDINGS: A new left-sided single lead AICD has been placed with lead
projecting in the region of the right ventricle. Cardiac silhouette
is upper limits of normal in size. There is persistent, mild
elevation of the right hemidiaphragm. Parenchymal lung opacities on
the prior study have resolved. There is no evidence of new airspace
consolidation, pulmonary edema, pleural effusion, or pneumothorax.
Lateral radiograph is limited by patient's inability to raise the
arm. No acute osseous abnormality is seen.
IMPRESSION: 1. New left-sided AICD as above.
2. No evidence of acute airspace disease. Interval clearing of
parenchymal infiltrates on the prior study.

## 2015-08-24 ENCOUNTER — Telehealth: Payer: Self-pay | Admitting: Internal Medicine

## 2015-08-24 NOTE — Telephone Encounter (Signed)
New message      Pt has a ICD.  He has moved to [place].  Please release his ICD info to Dr Rutherford Limerick at [facility] cardiology.  Their phone number is [phone] and their fax is [phone].  Please call pt when this has been done.

## 2015-08-24 NOTE — Telephone Encounter (Signed)
Released pt in [place]. Called and LMOVM for Dr. Aron Baba informing her that pt had been released.  Informed pt of this information.

## 2015-09-19 ENCOUNTER — Telehealth: Payer: Self-pay

## 2015-09-19 ENCOUNTER — Other Ambulatory Visit: Payer: Self-pay | Admitting: Cardiovascular Disease

## 2015-09-19 NOTE — Telephone Encounter (Signed)
The pt's medication should be refilled by cardiologist in [place].  Thank you

## 2015-10-17 ENCOUNTER — Other Ambulatory Visit: Payer: Self-pay | Admitting: Cardiovascular Disease

## 2015-11-16 ENCOUNTER — Other Ambulatory Visit: Payer: Self-pay | Admitting: Cardiovascular Disease

## 2015-11-16 NOTE — Telephone Encounter (Signed)
Refill refused as patient has relocated to [place].

## 2015-12-16 ENCOUNTER — Other Ambulatory Visit: Payer: Self-pay | Admitting: Cardiovascular Disease

## 2016-07-13 ENCOUNTER — Other Ambulatory Visit: Payer: Self-pay | Admitting: Cardiovascular Disease

## 2016-07-13 DIAGNOSIS — I255 Ischemic cardiomyopathy: Secondary | ICD-10-CM

## 2016-07-13 DIAGNOSIS — I2510 Atherosclerotic heart disease of native coronary artery without angina pectoris: Secondary | ICD-10-CM

## 2016-08-12 ENCOUNTER — Other Ambulatory Visit: Payer: Self-pay | Admitting: Cardiovascular Disease

## 2016-08-12 DIAGNOSIS — I2510 Atherosclerotic heart disease of native coronary artery without angina pectoris: Secondary | ICD-10-CM

## 2016-08-12 DIAGNOSIS — I255 Ischemic cardiomyopathy: Secondary | ICD-10-CM

## 2016-09-13 ENCOUNTER — Other Ambulatory Visit: Payer: Self-pay | Admitting: Cardiovascular Disease

## 2016-09-13 DIAGNOSIS — I2510 Atherosclerotic heart disease of native coronary artery without angina pectoris: Secondary | ICD-10-CM

## 2016-09-13 DIAGNOSIS — I255 Ischemic cardiomyopathy: Secondary | ICD-10-CM

## 2016-10-11 ENCOUNTER — Other Ambulatory Visit: Payer: Self-pay | Admitting: Cardiovascular Disease

## 2016-10-11 DIAGNOSIS — I255 Ischemic cardiomyopathy: Secondary | ICD-10-CM

## 2016-10-11 DIAGNOSIS — I2510 Atherosclerotic heart disease of native coronary artery without angina pectoris: Secondary | ICD-10-CM

## 2016-11-10 ENCOUNTER — Other Ambulatory Visit: Payer: Self-pay | Admitting: Cardiovascular Disease

## 2016-11-10 DIAGNOSIS — I2510 Atherosclerotic heart disease of native coronary artery without angina pectoris: Secondary | ICD-10-CM

## 2016-11-10 DIAGNOSIS — I255 Ischemic cardiomyopathy: Secondary | ICD-10-CM

## 2016-12-10 ENCOUNTER — Other Ambulatory Visit: Payer: Self-pay | Admitting: Cardiovascular Disease

## 2016-12-10 DIAGNOSIS — I255 Ischemic cardiomyopathy: Secondary | ICD-10-CM

## 2016-12-10 DIAGNOSIS — I2510 Atherosclerotic heart disease of native coronary artery without angina pectoris: Secondary | ICD-10-CM

## 2017-01-11 ENCOUNTER — Other Ambulatory Visit: Payer: Self-pay | Admitting: Cardiovascular Disease

## 2017-01-11 DIAGNOSIS — I255 Ischemic cardiomyopathy: Secondary | ICD-10-CM

## 2017-01-11 DIAGNOSIS — I2510 Atherosclerotic heart disease of native coronary artery without angina pectoris: Secondary | ICD-10-CM

## 2017-02-10 ENCOUNTER — Other Ambulatory Visit: Payer: Self-pay | Admitting: Cardiovascular Disease

## 2017-02-10 DIAGNOSIS — I2510 Atherosclerotic heart disease of native coronary artery without angina pectoris: Secondary | ICD-10-CM

## 2017-02-10 DIAGNOSIS — I255 Ischemic cardiomyopathy: Secondary | ICD-10-CM

## 2017-03-12 ENCOUNTER — Other Ambulatory Visit: Payer: Self-pay | Admitting: Cardiovascular Disease

## 2017-03-12 DIAGNOSIS — I255 Ischemic cardiomyopathy: Secondary | ICD-10-CM

## 2017-03-12 DIAGNOSIS — I2510 Atherosclerotic heart disease of native coronary artery without angina pectoris: Secondary | ICD-10-CM

## 2017-04-11 ENCOUNTER — Other Ambulatory Visit: Payer: Self-pay | Admitting: Cardiovascular Disease

## 2017-04-11 DIAGNOSIS — I255 Ischemic cardiomyopathy: Secondary | ICD-10-CM

## 2017-04-11 DIAGNOSIS — I2510 Atherosclerotic heart disease of native coronary artery without angina pectoris: Secondary | ICD-10-CM

## 2017-05-11 ENCOUNTER — Other Ambulatory Visit: Payer: Self-pay | Admitting: Cardiovascular Disease

## 2017-05-11 DIAGNOSIS — I2510 Atherosclerotic heart disease of native coronary artery without angina pectoris: Secondary | ICD-10-CM

## 2017-05-11 DIAGNOSIS — I255 Ischemic cardiomyopathy: Secondary | ICD-10-CM

## 2017-06-10 ENCOUNTER — Other Ambulatory Visit: Payer: Self-pay | Admitting: Cardiovascular Disease

## 2017-06-10 DIAGNOSIS — I255 Ischemic cardiomyopathy: Secondary | ICD-10-CM

## 2017-06-10 DIAGNOSIS — I2510 Atherosclerotic heart disease of native coronary artery without angina pectoris: Secondary | ICD-10-CM

## 2017-07-10 ENCOUNTER — Other Ambulatory Visit: Payer: Self-pay | Admitting: Cardiovascular Disease

## 2017-07-10 DIAGNOSIS — I255 Ischemic cardiomyopathy: Secondary | ICD-10-CM

## 2017-07-10 DIAGNOSIS — I2510 Atherosclerotic heart disease of native coronary artery without angina pectoris: Secondary | ICD-10-CM

## 2017-07-10 NOTE — Telephone Encounter (Signed)
Patient moved to [facility] and is no longer follow by our practice. Please contact Dr Rutherford Limerick at [facility] cardiology. [phone]

## 2017-08-09 ENCOUNTER — Other Ambulatory Visit: Payer: Self-pay | Admitting: Cardiovascular Disease

## 2017-08-09 DIAGNOSIS — I2510 Atherosclerotic heart disease of native coronary artery without angina pectoris: Secondary | ICD-10-CM

## 2017-08-09 DIAGNOSIS — I255 Ischemic cardiomyopathy: Secondary | ICD-10-CM

## 2017-09-08 ENCOUNTER — Other Ambulatory Visit: Payer: Self-pay | Admitting: Cardiovascular Disease

## 2017-09-08 DIAGNOSIS — I255 Ischemic cardiomyopathy: Secondary | ICD-10-CM

## 2017-09-08 DIAGNOSIS — I2510 Atherosclerotic heart disease of native coronary artery without angina pectoris: Secondary | ICD-10-CM

## 2018-03-09 ENCOUNTER — Other Ambulatory Visit: Payer: Self-pay | Admitting: Cardiovascular Disease

## 2018-03-09 DIAGNOSIS — I2510 Atherosclerotic heart disease of native coronary artery without angina pectoris: Secondary | ICD-10-CM

## 2018-03-09 DIAGNOSIS — I255 Ischemic cardiomyopathy: Secondary | ICD-10-CM

## 2018-04-06 ENCOUNTER — Other Ambulatory Visit: Payer: Self-pay

## 2018-04-06 DIAGNOSIS — I2510 Atherosclerotic heart disease of native coronary artery without angina pectoris: Secondary | ICD-10-CM

## 2018-04-06 DIAGNOSIS — I255 Ischemic cardiomyopathy: Secondary | ICD-10-CM

## 2018-04-06 MED ORDER — CARVEDILOL 12.5 MG PO TABS: 12.5000 mg | ORAL_TABLET | Freq: Two times a day (BID) | ORAL | 0 refills | Status: AC
